# Patient Record
Sex: Male | Born: 2000 | Race: Black or African American | Hispanic: No | Marital: Single | Smoking: Current every day smoker
Health system: Southern US, Community
[De-identification: ages and names within clinical notes are randomized; demographics above are authoritative.]

## PROBLEM LIST (undated history)

## (undated) DIAGNOSIS — R0600 Dyspnea, unspecified: Secondary | ICD-10-CM

## (undated) DIAGNOSIS — B958 Unspecified staphylococcus as the cause of diseases classified elsewhere: Secondary | ICD-10-CM

## (undated) DIAGNOSIS — J45909 Unspecified asthma, uncomplicated: Secondary | ICD-10-CM

---
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
## 2019-02-04 DIAGNOSIS — Z5181 Encounter for therapeutic drug level monitoring: Secondary | ICD-10-CM | POA: Diagnosis not present

## 2019-02-20 DIAGNOSIS — Z5181 Encounter for therapeutic drug level monitoring: Secondary | ICD-10-CM | POA: Diagnosis not present

## 2019-02-25 DIAGNOSIS — Z5181 Encounter for therapeutic drug level monitoring: Secondary | ICD-10-CM | POA: Diagnosis not present

## 2019-03-06 DIAGNOSIS — Z5181 Encounter for therapeutic drug level monitoring: Secondary | ICD-10-CM | POA: Diagnosis not present

## 2019-03-12 DIAGNOSIS — Z5181 Encounter for therapeutic drug level monitoring: Secondary | ICD-10-CM | POA: Diagnosis not present

## 2019-03-19 DIAGNOSIS — Z5181 Encounter for therapeutic drug level monitoring: Secondary | ICD-10-CM | POA: Diagnosis not present

## 2019-03-25 DIAGNOSIS — Z5181 Encounter for therapeutic drug level monitoring: Secondary | ICD-10-CM | POA: Diagnosis not present

## 2019-10-30 DIAGNOSIS — Z419 Encounter for procedure for purposes other than remedying health state, unspecified: Secondary | ICD-10-CM | POA: Diagnosis not present

## 2019-11-30 DIAGNOSIS — Z419 Encounter for procedure for purposes other than remedying health state, unspecified: Secondary | ICD-10-CM | POA: Diagnosis not present

## 2019-12-22 ENCOUNTER — Other Ambulatory Visit: Payer: Self-pay

## 2019-12-22 ENCOUNTER — Encounter (HOSPITAL_COMMUNITY): Payer: Self-pay | Admitting: Emergency Medicine

## 2019-12-22 ENCOUNTER — Emergency Department (HOSPITAL_COMMUNITY)
Admission: EM | Admit: 2019-12-22 | Discharge: 2019-12-23 | Disposition: A | Discharge status: Home / Self Care | Payer: Medicaid Other | Attending: Emergency Medicine | Admitting: Emergency Medicine

## 2019-12-22 DIAGNOSIS — Z5321 Procedure and treatment not carried out due to patient leaving prior to being seen by health care provider: Secondary | ICD-10-CM | POA: Insufficient documentation

## 2019-12-22 DIAGNOSIS — Z113 Encounter for screening for infections with a predominantly sexual mode of transmission: Secondary | ICD-10-CM | POA: Diagnosis present

## 2019-12-22 LAB — URINALYSIS, ROUTINE W REFLEX MICROSCOPIC
Bilirubin Urine: NEGATIVE
Glucose, UA: NEGATIVE mg/dL
Hgb urine dipstick: NEGATIVE
Ketones, ur: NEGATIVE mg/dL
Leukocytes,Ua: NEGATIVE
Nitrite: NEGATIVE
Protein, ur: NEGATIVE mg/dL
Specific Gravity, Urine: 1.015 (ref 1.005–1.030)
pH: 6 (ref 5.0–8.0)
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
## 2019-12-23 ENCOUNTER — Encounter (HOSPITAL_COMMUNITY): Payer: Self-pay | Admitting: Emergency Medicine

## 2019-12-23 ENCOUNTER — Emergency Department (HOSPITAL_COMMUNITY)
Admission: EM | Admit: 2019-12-23 | Discharge: 2019-12-23 | Disposition: A | Discharge status: Home / Self Care | Payer: Medicaid Other | Attending: Emergency Medicine | Admitting: Emergency Medicine

## 2019-12-23 ENCOUNTER — Other Ambulatory Visit: Payer: Self-pay

## 2019-12-23 DIAGNOSIS — K1370 Unspecified lesions of oral mucosa: Secondary | ICD-10-CM | POA: Diagnosis present

## 2019-12-23 DIAGNOSIS — B009 Herpesviral infection, unspecified: Secondary | ICD-10-CM | POA: Diagnosis not present

## 2019-12-23 DIAGNOSIS — B001 Herpesviral vesicular dermatitis: Secondary | ICD-10-CM | POA: Diagnosis not present

## 2019-12-23 HISTORY — DX: Unspecified staphylococcus as the cause of diseases classified elsewhere: B95.8

## 2019-12-23 NOTE — ED Triage Notes (Signed)
Pt states he wants the bump on lip checked. States that it showed up yesterday, thinks it could "herpes."
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
## 2019-12-23 NOTE — Discharge Instructions (Signed)
Please use the attached instructions, read them thoroughly. Do not kiss, have oral sex, or share personal items until your sores heal. Come back to the emergency department if you have any new or worsening concerning symptoms as we discussed you can also go to the health department to get fully STD tested, which I would recommend. The medication that we spoke about was acyclovir if your lesions become worse or if this were to recur which needs to be prescribed, however as we spoke about these lesions will most likely heal on their own in 2 weeks.

## 2019-12-23 NOTE — ED Notes (Signed)
Pt stated he is going wait, states "this is too long for anyone to wait"

## 2019-12-24 ENCOUNTER — Telehealth: Payer: Self-pay | Admitting: *Deleted

## 2019-12-24 NOTE — Telephone Encounter (Signed)
Transition Care Management Unsuccessful Follow-up Telephone Call  Date of discharge and from where:  [facility], 12/23/19 Attempts:  1st Attempt  Reason for unsuccessful TCM follow-up call:  Left message with patient's father.  Burnard Bunting, RN, BSN, CCRN [facility] [phone]

## 2019-12-24 NOTE — Telephone Encounter (Signed)
Called patient to make NP appointment,he was unavailable left message with dad to call back                                                   Avie Arenas                                      PEC                                                [phone]

## 2019-12-26 ENCOUNTER — Telehealth: Payer: Self-pay | Admitting: *Deleted

## 2019-12-26 NOTE — Telephone Encounter (Signed)
Called patient again to assist with NP ,number listed is patient's dad's , says he was given call back number yesterday.                                  Ricky Kim                                   .

## 2019-12-31 DIAGNOSIS — Z419 Encounter for procedure for purposes other than remedying health state, unspecified: Secondary | ICD-10-CM | POA: Diagnosis not present

## 2020-01-30 DIAGNOSIS — Z419 Encounter for procedure for purposes other than remedying health state, unspecified: Secondary | ICD-10-CM | POA: Diagnosis not present

## 2020-03-01 DIAGNOSIS — Z419 Encounter for procedure for purposes other than remedying health state, unspecified: Secondary | ICD-10-CM | POA: Diagnosis not present

## 2020-03-31 DIAGNOSIS — Z419 Encounter for procedure for purposes other than remedying health state, unspecified: Secondary | ICD-10-CM | POA: Diagnosis not present

## 2020-05-01 DIAGNOSIS — Z419 Encounter for procedure for purposes other than remedying health state, unspecified: Secondary | ICD-10-CM | POA: Diagnosis not present

## 2020-06-01 DIAGNOSIS — Z419 Encounter for procedure for purposes other than remedying health state, unspecified: Secondary | ICD-10-CM | POA: Diagnosis not present

## 2020-06-29 DIAGNOSIS — Z419 Encounter for procedure for purposes other than remedying health state, unspecified: Secondary | ICD-10-CM | POA: Diagnosis not present

## 2020-07-14 NOTE — Progress Notes (Signed)
Please disregard ,opened in error 

## 2020-07-20 ENCOUNTER — Other Ambulatory Visit: Payer: Self-pay

## 2020-07-20 ENCOUNTER — Emergency Department (HOSPITAL_COMMUNITY)
Admission: EM | Admit: 2020-07-20 | Discharge: 2020-07-20 | Disposition: A | Discharge status: Home / Self Care | Payer: Medicaid Other | Attending: Emergency Medicine | Admitting: Emergency Medicine

## 2020-07-20 ENCOUNTER — Encounter (HOSPITAL_COMMUNITY): Payer: Self-pay

## 2020-07-20 DIAGNOSIS — Z113 Encounter for screening for infections with a predominantly sexual mode of transmission: Secondary | ICD-10-CM | POA: Insufficient documentation

## 2020-07-20 DIAGNOSIS — R21 Rash and other nonspecific skin eruption: Secondary | ICD-10-CM | POA: Diagnosis not present

## 2020-07-20 DIAGNOSIS — Z8619 Personal history of other infectious and parasitic diseases: Secondary | ICD-10-CM | POA: Insufficient documentation

## 2020-07-20 LAB — HIV ANTIBODY (ROUTINE TESTING W REFLEX): HIV Screen 4th Generation wRfx: NONREACTIVE

## 2020-07-20 MED ORDER — VALACYCLOVIR HCL 1 G PO TABS: 2000.0000 mg | ORAL_TABLET | Freq: Two times a day (BID) | ORAL | 3 refills | Status: AC | PRN

## 2020-07-20 NOTE — Discharge Instructions (Signed)
I have provided information about herpes labialis.  You only take the medicine prescribed for 2 doses, the day you notice tingling or soreness developing in your lip (the earlier you take the medicine when you recognize this symptom, the more likely it will help it go away quickly).  You have been given several refills if needed for future breakouts.    You have been screened for stds and will be notified for any positive tests, but you do not have any symptoms today suggesting any infections. You may follow up with the health department for any future std screenings or concerns.

## 2020-07-20 NOTE — ED Triage Notes (Signed)
Pt presents to ED with complaints of rash to bilateral arms and legs "for a while." Denies itching.

## 2020-07-21 ENCOUNTER — Telehealth (HOSPITAL_COMMUNITY): Payer: Self-pay | Admitting: Emergency Medicine

## 2020-07-21 ENCOUNTER — Telehealth: Payer: Self-pay

## 2020-07-21 LAB — GC/CHLAMYDIA PROBE AMP (~~LOC~~) NOT AT ARMC
Chlamydia: POSITIVE — AB
Comment: NEGATIVE
Comment: NORMAL
Neisseria Gonorrhea: NEGATIVE

## 2020-07-21 LAB — RPR: RPR Ser Ql: NONREACTIVE

## 2020-07-21 MED ORDER — DOXYCYCLINE HYCLATE 100 MG PO CAPS: 100.0000 mg | ORAL_CAPSULE | Freq: Two times a day (BID) | ORAL | 0 refills | Status: DC

## 2020-07-21 NOTE — Telephone Encounter (Signed)
Transition Care Management Unsuccessful Follow-up Telephone Call  Date of discharge and from where:  07/20/2020 from [facility]  Attempts:  1st Attempt  Reason for unsuccessful TCM follow-up call:  Unable to leave message

## 2020-07-21 NOTE — Telephone Encounter (Signed)
Pt's gc/chlamydia cx positive for chlamydia.  Doxycycline 100 mg bid x 7 days sent to [facility].  Message left on pt's mobile voice mail to contact the ED dept.

## 2020-07-21 NOTE — ED Provider Notes (Addendum)
[facility] EMERGENCY DEPARTMENT Provider Note   CSN: [id number] Arrival date & time: 07/20/20  0940     History Chief Complaint  Patient presents with  . Rash    Ricky Kim is a 20 y.o. 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  His primary concern is that he did not get the valtrex filled when he had the labial herpes outbreak and is concerned he has spreading disease by not getting the medicine filled.  He denies genital rash, also denies penile discharge, fevers, chills, abd pain, dysuria, sore throat, headache and has had no further outbreaks of the lip rash.  He is sexually active but not with the same partner that "gave him the herpes".    Is is interested in std screening today.   HPI     Past Medical History:  Diagnosis Date  . Staph infection     There are no problems to display for this patient.   History reviewed. No pertinent surgical history.     No family history on file.  Social History   Tobacco Use  . Smoking status: Never Smoker  . Smokeless tobacco: Never Used  Substance Use Topics  . Alcohol use: Never  . Drug use: Never    Home Medications Prior to Admission medications   Medication Sig Start Date End Date Taking? Authorizing Provider  valACYclovir (VALTREX) 1000 MG tablet Take 2 tablets (2,000 mg total) by mouth 2 (two) times daily as needed for up to 1 day (only take if you develop herpes labialis outbreak). 07/20/20 07/21/20 Yes IdolRaynelle Fanning, PA-C    Allergies    Patient has no known allergies.  Review of Systems   Review of Systems  Constitutional: Negative for chills and fever.  HENT: Negative for sore throat.   Eyes: Negative.   Respiratory: Negative for chest tightness and  shortness of breath.   Cardiovascular: Negative for chest pain.  Gastrointestinal: Negative for abdominal pain, nausea and vomiting.  Genitourinary: Negative.  Negative for dysuria, penile discharge and urgency.  Musculoskeletal: Negative for arthralgias, joint swelling and neck pain.  Skin: Positive for rash. Negative for wound.  Neurological: Negative for dizziness, weakness, light-headedness, numbness and headaches.  Psychiatric/Behavioral: Negative.     Physical Exam Updated Vital Signs BP 118/68   Pulse 64   Temp 98 F (36.7 C) (Oral)   Resp 16   Ht 5\' 8"  (1.727 m)   Wt 59.9 kg   SpO2 100%   BMI 20.07 kg/m   Physical Exam Vitals and nursing note reviewed.  Constitutional:      Appearance: He is well-developed.  HENT:     Head: Normocephalic and atraumatic.     Mouth/Throat:     Mouth: Mucous membranes are moist.     Pharynx: Oropharynx is clear.  Eyes:     Conjunctiva/sclera: Conjunctivae normal.  Cardiovascular:     Rate and Rhythm: Normal rate and regular rhythm.     Heart sounds: Normal heart sounds.  Pulmonary:     Effort: Pulmonary effort is normal.     Breath sounds: Normal breath sounds. No wheezing.  Abdominal:     General: Bowel sounds are normal.     Palpations: Abdomen is soft.     Tenderness: There is  no abdominal tenderness. There is no guarding.  Musculoskeletal:        General: Normal range of motion.     Cervical back: Normal range of motion.  Skin:    General: Skin is warm and dry.     Findings: No rash.  Neurological:     General: No focal deficit present.     Mental Status: He is alert.     ED Results / Procedures / Treatments   Labs (all labs ordered are listed, but only abnormal results are displayed) Labs Reviewed  GC/CHLAMYDIA PROBE AMP ([place]) NOT AT [facility] - Abnormal; Notable for the following components:      Result Value   Chlamydia Positive (*)    All other components within normal limits  RPR  HIV ANTIBODY (ROUTINE  TESTING W REFLEX)    EKG None  Radiology No results found.  Procedures Procedures   Medications Ordered in ED Medications - No data to display  ED Course  I have reviewed the triage vital signs and the nursing notes.  Pertinent labs & imaging results that were available during my care of the patient were reviewed by me and considered in my medical decision making (see chart for details).    MDM Rules/Calculators/A&P                          Significant time spent with patient discussing Herpes simplex I, transmission, reasons and timing of treatment.  He was given a prescription for valtrex but asked to hold the prescription and only take if he and when he develops another outbreak.  Reassurance given about the cold inducing rash, currently not present with normal exam.    STD screening provided at his request, although denies sx.  rpr added to rule out as atypical rash presentation.  Final Clinical Impression(s) / ED Diagnoses Final diagnoses:  Rash and nonspecific skin eruption  History of herpes labialis  Screening examination for STD (sexually transmitted disease)    Rx / DC Orders ED Discharge Orders         Ordered    valACYclovir (VALTREX) 1000 MG tablet  2 times daily PRN        07/20/20 1347           Burgess Amor, PA-C 07/21/20 1632   GC/cllamydia culture positive for chlamydia.  Doxycycline 100 mg bid x 7 days prescribed.  Pt to be notified.    Burgess Amor, PA-C 07/21/20 1641    Pricilla Loveless, MD 07/23/20 [id number]

## 2020-07-22 NOTE — Telephone Encounter (Signed)
Pt was not seen in the ED on 07/20/2020. He left without being seen. Message will be closed.

## 2020-07-30 DIAGNOSIS — Z419 Encounter for procedure for purposes other than remedying health state, unspecified: Secondary | ICD-10-CM | POA: Diagnosis not present

## 2020-08-08 ENCOUNTER — Encounter (HOSPITAL_COMMUNITY): Payer: Self-pay | Admitting: Emergency Medicine

## 2020-08-08 ENCOUNTER — Emergency Department (HOSPITAL_COMMUNITY): Payer: Medicaid Other

## 2020-08-08 ENCOUNTER — Other Ambulatory Visit: Payer: Self-pay

## 2020-08-08 ENCOUNTER — Emergency Department (HOSPITAL_COMMUNITY)
Admission: EM | Admit: 2020-08-08 | Discharge: 2020-08-08 | Disposition: A | Discharge status: Home / Self Care | Payer: Medicaid Other | Attending: Emergency Medicine | Admitting: Emergency Medicine

## 2020-08-08 DIAGNOSIS — M791 Myalgia, unspecified site: Secondary | ICD-10-CM

## 2020-08-08 DIAGNOSIS — Z20822 Contact with and (suspected) exposure to covid-19: Secondary | ICD-10-CM | POA: Insufficient documentation

## 2020-08-08 DIAGNOSIS — M7918 Myalgia, other site: Secondary | ICD-10-CM | POA: Diagnosis not present

## 2020-08-08 DIAGNOSIS — N341 Nonspecific urethritis: Secondary | ICD-10-CM | POA: Insufficient documentation

## 2020-08-08 DIAGNOSIS — R079 Chest pain, unspecified: Secondary | ICD-10-CM

## 2020-08-08 DIAGNOSIS — N342 Other urethritis: Secondary | ICD-10-CM

## 2020-08-08 LAB — RAPID URINE DRUG SCREEN, HOSP PERFORMED
Amphetamines: NOT DETECTED
Barbiturates: NOT DETECTED
Benzodiazepines: NOT DETECTED
Cocaine: NOT DETECTED
Opiates: NOT DETECTED
Tetrahydrocannabinol: POSITIVE — AB

## 2020-08-08 LAB — HIV ANTIBODY (ROUTINE TESTING W REFLEX): HIV Screen 4th Generation wRfx: NONREACTIVE

## 2020-08-08 LAB — COMPREHENSIVE METABOLIC PANEL
ALT: 20 U/L (ref 0–44)
AST: 25 U/L (ref 15–41)
Albumin: 4.1 g/dL (ref 3.5–5.0)
Alkaline Phosphatase: 58 U/L (ref 38–126)
Anion gap: 8 (ref 5–15)
BUN: 13 mg/dL (ref 6–20)
CO2: 24 mmol/L (ref 22–32)
Calcium: 9.6 mg/dL (ref 8.9–10.3)
Chloride: 105 mmol/L (ref 98–111)
Creatinine, Ser: 0.96 mg/dL (ref 0.61–1.24)
GFR, Estimated: >60 mL/min (ref >60)
Glucose, Bld: 96 mg/dL (ref 70–99)
Potassium: 3.8 mmol/L (ref 3.5–5.1)
Sodium: 137 mmol/L (ref 135–145)
Total Bilirubin: 0.5 mg/dL (ref 0.3–1.2)
Total Protein: 7.5 g/dL (ref 6.5–8.1)

## 2020-08-08 LAB — URINALYSIS, ROUTINE W REFLEX MICROSCOPIC
Bilirubin Urine: NEGATIVE
Glucose, UA: NEGATIVE mg/dL
Hgb urine dipstick: NEGATIVE
Ketones, ur: NEGATIVE mg/dL
Leukocytes,Ua: NEGATIVE
Nitrite: NEGATIVE
Protein, ur: NEGATIVE mg/dL
Specific Gravity, Urine: 1.017 (ref 1.005–1.030)
pH: 6 (ref 5.0–8.0)

## 2020-08-08 LAB — TROPONIN I (HIGH SENSITIVITY)
Troponin I (High Sensitivity): 3 ng/L (ref <18)
Troponin I (High Sensitivity): 3 ng/L (ref <18)

## 2020-08-08 LAB — CBC
HCT: 47.2 % (ref 39.0–52.0)
Hemoglobin: 15.9 g/dL (ref 13.0–17.0)
MCH: 32.8 pg (ref 26.0–34.0)
MCHC: 33.7 g/dL (ref 30.0–36.0)
MCV: 97.3 fL (ref 80.0–100.0)
Platelets: 184 10*3/uL (ref 150–400)
RBC: 4.85 MIL/uL (ref 4.22–5.81)
RDW: 11.2 % — ABNORMAL LOW (ref 11.5–15.5)
WBC: 6.9 10*3/uL (ref 4.0–10.5)
nRBC: 0 % (ref 0.0–0.2)

## 2020-08-08 LAB — SARS CORONAVIRUS 2 (TAT 6-24 HRS): SARS Coronavirus 2: NEGATIVE

## 2020-08-08 LAB — LIPASE, BLOOD: Lipase: 26 U/L (ref 11–51)

## 2020-08-08 MED ORDER — CEFTRIAXONE SODIUM 500 MG IJ SOLR
500.0000 mg | Freq: Once | INTRAMUSCULAR | Status: AC
  Administered 2020-08-08: 500 mg via INTRAMUSCULAR
  Filled 2020-08-08: qty 500

## 2020-08-08 MED ORDER — DOXYCYCLINE HYCLATE 100 MG PO CAPS: 100.0000 mg | ORAL_CAPSULE | Freq: Two times a day (BID) | ORAL | 0 refills | Status: DC

## 2020-08-08 MED ORDER — STERILE WATER FOR INJECTION IJ SOLN
INTRAMUSCULAR | Status: AC
  Filled 2020-08-08: qty 10

## 2020-08-08 NOTE — Discharge Instructions (Addendum)
See your doctor on 08/19/20, as scheduled. Take Tylenol every 4 hours as needed for pain. Make sure that you are drinking 1-2 liters of water every day, eating healthy foods, and getting 8 hours of sleep every night.  Have your sexual partner checked for STD.

## 2020-08-08 NOTE — ED Triage Notes (Addendum)
Pt reports feeling tired, abd pain, body aches, SOB, and chest pain x 1 year.

## 2020-08-08 NOTE — ED Provider Notes (Signed)
[facility] EMERGENCY DEPARTMENT Provider Note   CSN: [id number] Arrival date & time: 08/08/20  1119     History Chief Complaint  Patient presents with  . Chest Pain    Ricky Kim is a 20 y.o. male.  HPI Patient seen by me at 11:39 AM.  Sitting on chair at triage.  He states he is having both chest pain and generalized pain.  He states the chest pain has been present for a year, and got worse yesterday.  He states he feels like something is" wrong inside his body."  He states that sometimes he has bumps on his skin when he is cold and they go away when he gets warm.  He has not been seen or treated for these problems yet.  He is unable to describe the character of the pain.  He denies any chronic illnesses.  He denies shortness of breath, cough, focal weakness or paresthesia.  He is ambulatory and came here by private vehicle.    Past Medical History:  Diagnosis Date  . Staph infection     There are no problems to display for this patient.   History reviewed. No pertinent surgical history.     No family history on file.  Social History   Tobacco Use  . Smoking status: Never Smoker  . Smokeless tobacco: Never Used  Substance Use Topics  . Alcohol use: Yes  . Drug use: Yes    Types: Marijuana    Home Medications Prior to Admission medications   Medication Sig Start Date End Date Taking? Authorizing Provider  doxycycline (VIBRAMYCIN) 100 MG capsule Take 1 capsule (100 mg total) by mouth 2 (two) times daily. One po bid x 7 days 08/08/20  Yes Mancel Bale, MD  amoxicillin-clavulanate (AUGMENTIN ES-600) 600-42.9 MG/5ML suspension Take 5 mLs (600 mg total) by mouth 2 (two) times daily. 02/26/12   Marcellina Millin, MD  diphenhydrAMINE (BENADRYL) 12.5 MG/5ML liquid Take 6.25 mg by mouth 4 (four) times daily as needed. For allergies/itching    [provider]  trimethoprim-polymyxin b (POLYTRIM) ophthalmic solution Place 1 drop into the right  eye every 4 (four) hours. X 7 days 09/08/13   Ria Clock, PA    Allergies    Patient has no known allergies.  Review of Systems   Review of Systems  All other systems reviewed and are negative.   Physical Exam Updated Vital Signs BP (!) 139/94   Pulse 60   Temp 98.4 F (36.9 C)   Resp 19   SpO2 100%   Physical Exam Vitals and nursing note reviewed.  Constitutional:      General: He is not in acute distress.    Appearance: He is well-developed. He is not ill-appearing.  HENT:     Head: Normocephalic and atraumatic.     Right Ear: External ear normal.     Left Ear: External ear normal.  Eyes:     Conjunctiva/sclera: Conjunctivae normal.     Pupils: Pupils are equal, round, and reactive to light.  Neck:     Trachea: Phonation normal.  Cardiovascular:     Rate and Rhythm: Normal rate and regular rhythm.     Heart sounds: Normal heart sounds.  Pulmonary:     Effort: Pulmonary effort is normal. No respiratory distress.     Breath sounds: Normal breath sounds. No stridor.  Abdominal:     General: There is no distension.     Palpations: Abdomen is  soft.     Tenderness: There is no abdominal tenderness.  Musculoskeletal:        General: Normal range of motion.     Cervical back: Normal range of motion and neck supple.  Skin:    General: Skin is warm and dry.  Neurological:     Mental Status: He is alert and oriented to person, place, and time.     Cranial Nerves: No cranial nerve deficit.     Sensory: No sensory deficit.     Motor: No abnormal muscle tone.     Coordination: Coordination normal.  Psychiatric:        Attention and Perception: Attention normal.        Mood and Affect: Mood is anxious.        Speech: He is communicative.        Behavior: Behavior is not agitated, slowed or withdrawn.        Thought Content: Thought content is not paranoid.        Cognition and Memory: Cognition is not impaired.     ED Results / Procedures / Treatments    Labs (all labs ordered are listed, but only abnormal results are displayed) Labs Reviewed  CBC - Abnormal; Notable for the following components:      Result Value   RDW 11.2 (*)    All other components within normal limits  SARS CORONAVIRUS 2 (TAT 6-24 HRS)  LIPASE, BLOOD  COMPREHENSIVE METABOLIC PANEL  URINALYSIS, ROUTINE W REFLEX MICROSCOPIC  RAPID URINE DRUG SCREEN, HOSP PERFORMED  RPR  HIV ANTIBODY (ROUTINE TESTING W REFLEX)  GC/CHLAMYDIA PROBE AMP ([place]) NOT AT [facility]  TROPONIN I (HIGH SENSITIVITY)  TROPONIN I (HIGH SENSITIVITY)    EKG EKG Interpretation  Date/Time:  Sunday August 08 2020 11:34:37 EDT Ventricular Rate:  67 PR Interval:  128 QRS Duration: 82 QT Interval:  368 QTC Calculation: 388 R Axis:   69 Text Interpretation: Sinus rhythm with Premature atrial complexes with Abberant conduction Nonspecific ST abnormality Abnormal ECG No old tracing to compare Confirmed by Mancel Bale [id number]) on 08/08/2020 11:51:33 AM   Radiology DG Chest Port 1 View  Result Date: 08/08/2020 CLINICAL DATA:  Chest pain and weight loss EXAM: PORTABLE CHEST 1 VIEW COMPARISON:  None FINDINGS: Lungs are clear. The heart size and pulmonary vascularity are normal. No adenopathy. No pneumothorax. No bone lesions. IMPRESSION: Lungs clear.  Cardiac silhouette normal. Electronically Signed   By: Bretta Bang Kim M.D.   On: 08/08/2020 13:20    Procedures Procedures   Medications Ordered in ED Medications  cefTRIAXone (ROCEPHIN) injection 500 mg (has no administration in time range)    ED Course  I have reviewed the triage vital signs and the nursing notes.  Pertinent labs & imaging results that were available during my care of the patient were reviewed by me and considered in my medical decision making (see chart for details).  Clinical Course as of 08/08/20 1447  Sun Aug 08, 2020  1153 I discussed abnormal EKG with Dr. Eldridge Dace, on-call for STEMI.  He does not feel that  this EKG and clinical presentation are consistent with ST segment elevation MI. [EW]  1153 We will proceed with usual ED evaluation and reassessment. [EW]  1408 He has an appointment for PCP eval., new visit, on 08/19/20.  [EW]  1408 He request Covid test.Will send and d/c. He will check result on MyChart. [EW]    Clinical Course User Index [EW] Mancel Bale, MD  MDM Rules/Calculators/A&P                           Patient Vitals for the past 24 hrs:  BP Temp Pulse Resp SpO2  08/08/20 1415 (!) 139/94 -- 60 19 100 %  08/08/20 1400 127/77 -- 64 13 100 %  08/08/20 1330 127/83 -- (!) 57 (!) 25 100 %  08/08/20 1315 (!) 138/100 -- (!) 59 20 99 %  08/08/20 1300 (!) 114/100 -- 69 12 100 %  08/08/20 1148 (!) 135/94 98.4 F (36.9 C) 60 14 99 %    2:43 PM Reevaluation with update and discussion. After initial assessment and treatment, an updated evaluation reveals after initial discharge, he complained of urethral discharge and wondered if he had an STD.  Examined at this time, he has normal-appearing external genitalia, he is uncircumcised.  Testicles are nontender and there are no scrotal abnormalities.  Urethra has a minimal amount of slightly opaque discharge.  GC chlamydia probe taken from the urethral orifice.  Patient treated with Rocephin empirically for GC and prescription given for doxycycline for possible chlamydia infection.Mancel Bale   Medical Decision Making:  This patient is presenting for evaluation of chest pain, myalgia, urethral discharge, which does require a range of treatment options, and is a complaint that involves a moderate risk of morbidity and mortality. The differential diagnoses include ACS, pulmonary disorder, acute infectious process, urethritis. I decided to review old records, and in summary healthy young male presenting for nonspecific symptoms requiring ED evaluation..  I did not require additional historical information from anyone.  Clinical Laboratory  Tests Ordered, included CBC, Metabolic panel and UDS, troponin, COVID test, STD swab from urethral meatus, HIV testing ordered. Review indicates patient laboratory tests all normal, labs pending at discharge. Radiologic Tests Ordered, included chest Xray.  I independently Visualized: Radiograph images, which show no acute abnormality  Cardiac Monitor Tracing which shows normal sinus rhythm   Critical Interventions-clinical monitoring, laboratory testing, observation,reevaluation.  Treat empirically for urethritis  After These Interventions, the Patient was reevaluated and was found stable for discharge.  Doubt ACS, or acute metabolic disorder.  Patient with incidental complaint of urethral discharge possibly related to STD.  Treated empirically in the ED and prescription given for doxycycline.  PCR pending at discharge.  CRITICAL CARE-no Performed by: Mancel Bale  Nursing Notes Reviewed/ Care Coordinated Applicable Imaging Reviewed Interpretation of Laboratory Data incorporated into ED treatment  The patient appears reasonably screened and/or stabilized for discharge and I doubt any other medical condition or other [facility] requiring further screening, evaluation, or treatment in the ED at this time prior to discharge.  Plan: Home Medications-OTC analgesia of choice; Home Treatments-encouraged sexual health, and gradually increasing activity; return here if the recommended treatment, does not improve the symptoms; Recommended follow up-follow-up with the PCP on 08/19/2020 as scheduled.  Have her review old records from today, and discuss ongoing management.     Final Clinical Impression(s) / ED Diagnoses Final diagnoses:  Nonspecific chest pain  Myalgia  Urethritis    Rx / DC Orders ED Discharge Orders         Ordered    doxycycline (VIBRAMYCIN) 100 MG capsule  2 times daily        08/08/20 1440           Mancel Bale, MD 08/08/20 1447

## 2020-08-09 ENCOUNTER — Telehealth: Payer: Self-pay | Admitting: *Deleted

## 2020-08-09 LAB — GC/CHLAMYDIA PROBE AMP (~~LOC~~) NOT AT ARMC
Chlamydia: NEGATIVE
Comment: NEGATIVE
Comment: NORMAL
Neisseria Gonorrhea: NEGATIVE

## 2020-08-09 LAB — RPR: RPR Ser Ql: NONREACTIVE

## 2020-08-09 NOTE — Telephone Encounter (Signed)
Transition Care Management Follow-up Telephone Call  Date of discharge and from where: 08/08/2020 - [facility]  How have you been since you were released from the hospital? "Still feel the same"  Any questions or concerns? No  Items Reviewed:  Did the pt receive and understand the discharge instructions provided? Yes   Medications obtained and verified? Yes   Other? No   Any new allergies since your discharge? No   Dietary orders reviewed? No  Do you have support at home? Yes    Functional Questionnaire: (I = Independent and D = Dependent) ADLs: I  Bathing/Dressing- I  Meal Prep- I  Eating- I  Maintaining continence- I  Transferring/Ambulation- I  Managing Meds- I  Follow up appointments reviewed:   PCP Hospital f/u appt confirmed? Yes  Scheduled to see Gwinda Passe, NP with [facility] on 08/19/2020 @ 1330.  Specialist Hospital f/u appt confirmed? No    Are transportation arrangements needed? No   If their condition worsens, is the pt aware to call PCP or go to the Emergency Dept.? Yes  Was the patient provided with contact information for the PCP's office or ED? Yes  Was to pt encouraged to call back with questions or concerns? Yes

## 2020-08-17 ENCOUNTER — Emergency Department (HOSPITAL_COMMUNITY)
Admission: EM | Admit: 2020-08-17 | Discharge: 2020-08-17 | Disposition: A | Discharge status: Home / Self Care | Payer: Medicaid Other | Attending: Emergency Medicine | Admitting: Emergency Medicine

## 2020-08-17 ENCOUNTER — Other Ambulatory Visit: Payer: Self-pay

## 2020-08-17 ENCOUNTER — Encounter (HOSPITAL_COMMUNITY): Payer: Self-pay | Admitting: *Deleted

## 2020-08-17 DIAGNOSIS — S61234A Puncture wound without foreign body of right ring finger without damage to nail, initial encounter: Secondary | ICD-10-CM | POA: Diagnosis not present

## 2020-08-17 DIAGNOSIS — Z23 Encounter for immunization: Secondary | ICD-10-CM | POA: Insufficient documentation

## 2020-08-17 DIAGNOSIS — S61215A Laceration without foreign body of left ring finger without damage to nail, initial encounter: Secondary | ICD-10-CM | POA: Insufficient documentation

## 2020-08-17 DIAGNOSIS — S61235A Puncture wound without foreign body of left ring finger without damage to nail, initial encounter: Secondary | ICD-10-CM | POA: Insufficient documentation

## 2020-08-17 DIAGNOSIS — S61214A Laceration without foreign body of right ring finger without damage to nail, initial encounter: Secondary | ICD-10-CM | POA: Diagnosis not present

## 2020-08-17 MED ORDER — SULFAMETHOXAZOLE-TRIMETHOPRIM 800-160 MG PO TABS: 1.0000 | ORAL_TABLET | Freq: Two times a day (BID) | ORAL | 0 refills | Status: AC

## 2020-08-17 MED ORDER — TETANUS-DIPHTH-ACELL PERTUSSIS 5-2.5-18.5 LF-MCG/0.5 IM SUSY
0.5000 mL | PREFILLED_SYRINGE | Freq: Once | INTRAMUSCULAR | Status: AC
  Administered 2020-08-17: 0.5 mL via INTRAMUSCULAR
  Filled 2020-08-17: qty 0.5
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
## 2020-08-17 NOTE — ED Notes (Signed)
Unable to sign consent due to injury
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
## 2020-08-17 NOTE — ED Notes (Signed)
On duty [employer] officer to speak with pt, stated that address [place] it [place] and will send a deputy here to speak with pt.
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
## 2020-08-19 ENCOUNTER — Encounter (INDEPENDENT_AMBULATORY_CARE_PROVIDER_SITE_OTHER): Payer: Self-pay

## 2020-08-19 ENCOUNTER — Telehealth (INDEPENDENT_AMBULATORY_CARE_PROVIDER_SITE_OTHER): Payer: Medicaid Other | Admitting: Primary Care

## 2020-08-19 DIAGNOSIS — M79642 Pain in left hand: Secondary | ICD-10-CM | POA: Diagnosis not present

## 2020-08-19 DIAGNOSIS — M79641 Pain in right hand: Secondary | ICD-10-CM | POA: Diagnosis not present

## 2020-08-19 NOTE — Progress Notes (Addendum)
Called patient to review pre-procedure instructions for surgery tomorrow. Patient had unreliable phone service and poor reception. Was able to communicate NPO after midnight and arrival place and time of 1000am. Verbalized understanding and stated he was writing it down. Patient will need COVID test DOS.

## 2020-08-20 ENCOUNTER — Encounter (HOSPITAL_COMMUNITY)
Admission: RE | Disposition: A | Discharge status: Home / Self Care | Payer: Self-pay | Source: Home / Self Care | Attending: Orthopedic Surgery

## 2020-08-20 ENCOUNTER — Ambulatory Visit (HOSPITAL_COMMUNITY)
Admission: RE | Admit: 2020-08-20 | Discharge: 2020-08-20 | Disposition: A | Discharge status: Home / Self Care | Payer: Medicaid Other | Attending: Orthopedic Surgery | Admitting: Orthopedic Surgery

## 2020-08-20 ENCOUNTER — Ambulatory Visit (HOSPITAL_COMMUNITY): Payer: Medicaid Other | Admitting: Certified Registered Nurse Anesthetist

## 2020-08-20 ENCOUNTER — Encounter (HOSPITAL_COMMUNITY): Payer: Self-pay | Admitting: Orthopedic Surgery

## 2020-08-20 ENCOUNTER — Other Ambulatory Visit: Payer: Self-pay

## 2020-08-20 DIAGNOSIS — S66122A Laceration of flexor muscle, fascia and tendon of right middle finger at wrist and hand level, initial encounter: Secondary | ICD-10-CM | POA: Insufficient documentation

## 2020-08-20 DIAGNOSIS — S64495A Injury of digital nerve of left ring finger, initial encounter: Secondary | ICD-10-CM | POA: Diagnosis not present

## 2020-08-20 DIAGNOSIS — S64494A Injury of digital nerve of right ring finger, initial encounter: Secondary | ICD-10-CM | POA: Diagnosis not present

## 2020-08-20 DIAGNOSIS — S61412A Laceration without foreign body of left hand, initial encounter: Secondary | ICD-10-CM | POA: Diagnosis not present

## 2020-08-20 DIAGNOSIS — S66120A Laceration of flexor muscle, fascia and tendon of right index finger at wrist and hand level, initial encounter: Secondary | ICD-10-CM | POA: Insufficient documentation

## 2020-08-20 DIAGNOSIS — W260XXA Contact with knife, initial encounter: Secondary | ICD-10-CM | POA: Insufficient documentation

## 2020-08-20 DIAGNOSIS — S61411A Laceration without foreign body of right hand, initial encounter: Secondary | ICD-10-CM | POA: Diagnosis not present

## 2020-08-20 DIAGNOSIS — Z20822 Contact with and (suspected) exposure to covid-19: Secondary | ICD-10-CM | POA: Insufficient documentation

## 2020-08-20 DIAGNOSIS — S66124A Laceration of flexor muscle, fascia and tendon of right ring finger at wrist and hand level, initial encounter: Secondary | ICD-10-CM | POA: Insufficient documentation

## 2020-08-20 DIAGNOSIS — S66126A Laceration of flexor muscle, fascia and tendon of right little finger at wrist and hand level, initial encounter: Secondary | ICD-10-CM | POA: Insufficient documentation

## 2020-08-20 DIAGNOSIS — S64496A Injury of digital nerve of right little finger, initial encounter: Secondary | ICD-10-CM | POA: Insufficient documentation

## 2020-08-20 DIAGNOSIS — S66125A Laceration of flexor muscle, fascia and tendon of left ring finger at wrist and hand level, initial encounter: Secondary | ICD-10-CM | POA: Diagnosis not present

## 2020-08-20 HISTORY — DX: Dyspnea, unspecified: R06.00

## 2020-08-20 HISTORY — PX: INCISION AND DRAINAGE WOUND WITH TENDON REPAIR: SHX5639

## 2020-08-20 LAB — SARS CORONAVIRUS 2 BY RT PCR (HOSPITAL ORDER, PERFORMED IN ~~LOC~~ HOSPITAL LAB): SARS Coronavirus 2: NEGATIVE

## 2020-08-20 SURGERY — INCISION AND DRAINAGE WOUND WITH TENDON REPAIR
Anesthesia: General | Laterality: Bilateral

## 2020-08-20 MED ORDER — SODIUM CHLORIDE 0.9 % IR SOLN
Status: DC | PRN
  Administered 2020-08-20: 1000 mL

## 2020-08-20 MED ORDER — CEFAZOLIN SODIUM-DEXTROSE 2-4 GM/100ML-% IV SOLN
2.0000 g | INTRAVENOUS | Status: AC
  Administered 2020-08-20: 2 g via INTRAVENOUS

## 2020-08-20 MED ORDER — BUPIVACAINE HCL (PF) 0.25 % IJ SOLN
INTRAMUSCULAR | Status: AC
  Filled 2020-08-20: qty 30

## 2020-08-20 MED ORDER — FENTANYL CITRATE (PF) 250 MCG/5ML IJ SOLN
INTRAMUSCULAR | Status: AC
  Filled 2020-08-20: qty 5

## 2020-08-20 MED ORDER — CHLORHEXIDINE GLUCONATE 0.12 % MT SOLN: 15.0000 mL | Freq: Once | OROMUCOSAL | Status: AC

## 2020-08-20 MED ORDER — METHOCARBAMOL 500 MG PO TABS: 500.0000 mg | ORAL_TABLET | Freq: Four times a day (QID) | ORAL | 0 refills | Status: DC

## 2020-08-20 MED ORDER — BUPIVACAINE HCL (PF) 0.25 % IJ SOLN: INTRAMUSCULAR | Status: DC | PRN

## 2020-08-20 MED ORDER — ONDANSETRON HCL 4 MG/2ML IJ SOLN: 4.0000 mg | Freq: Once | INTRAMUSCULAR | Status: DC | PRN

## 2020-08-20 MED ORDER — ROCURONIUM BROMIDE 10 MG/ML (PF) SYRINGE
PREFILLED_SYRINGE | INTRAVENOUS | Status: DC | PRN
  Administered 2020-08-20: 20 mg via INTRAVENOUS
  Administered 2020-08-20: 50 mg via INTRAVENOUS

## 2020-08-20 MED ORDER — CHLORHEXIDINE GLUCONATE 0.12 % MT SOLN
OROMUCOSAL | Status: AC
  Administered 2020-08-20: 15 mL via OROMUCOSAL
  Filled 2020-08-20: qty 15

## 2020-08-20 MED ORDER — CHLORHEXIDINE GLUCONATE 0.12 % MT SOLN
15.0000 mL | OROMUCOSAL | Status: DC
  Filled 2020-08-20: qty 15

## 2020-08-20 MED ORDER — DEXAMETHASONE SODIUM PHOSPHATE 10 MG/ML IJ SOLN
INTRAMUSCULAR | Status: AC
  Filled 2020-08-20: qty 1

## 2020-08-20 MED ORDER — AMISULPRIDE (ANTIEMETIC) 5 MG/2ML IV SOLN: 10.0000 mg | Freq: Once | INTRAVENOUS | Status: DC | PRN

## 2020-08-20 MED ORDER — DEXAMETHASONE SODIUM PHOSPHATE 10 MG/ML IJ SOLN
INTRAMUSCULAR | Status: DC | PRN
  Administered 2020-08-20: 5 mg via INTRAVENOUS

## 2020-08-20 MED ORDER — MIDAZOLAM HCL 5 MG/5ML IJ SOLN
INTRAMUSCULAR | Status: DC | PRN
  Administered 2020-08-20: 2 mg via INTRAVENOUS

## 2020-08-20 MED ORDER — OXYCODONE HCL 5 MG PO TABS
ORAL_TABLET | ORAL | Status: AC
  Filled 2020-08-20: qty 1

## 2020-08-20 MED ORDER — FENTANYL CITRATE (PF) 250 MCG/5ML IJ SOLN
INTRAMUSCULAR | Status: DC | PRN
  Administered 2020-08-20: 50 ug via INTRAVENOUS
  Administered 2020-08-20: 100 ug via INTRAVENOUS
  Administered 2020-08-20 (×3): 50 ug via INTRAVENOUS

## 2020-08-20 MED ORDER — OXYCODONE HCL 5 MG PO TABS
5.0000 mg | ORAL_TABLET | Freq: Once | ORAL | Status: AC | PRN
  Administered 2020-08-20: 5 mg via ORAL

## 2020-08-20 MED ORDER — CEFAZOLIN SODIUM-DEXTROSE 2-4 GM/100ML-% IV SOLN
INTRAVENOUS | Status: AC
  Filled 2020-08-20: qty 100

## 2020-08-20 MED ORDER — LIDOCAINE 2% (20 MG/ML) 5 ML SYRINGE
INTRAMUSCULAR | Status: DC | PRN
  Administered 2020-08-20: 20 mg via INTRAVENOUS

## 2020-08-20 MED ORDER — LACTATED RINGERS IV SOLN: INTRAVENOUS | Status: DC

## 2020-08-20 MED ORDER — SUGAMMADEX SODIUM 200 MG/2ML IV SOLN
INTRAVENOUS | Status: DC | PRN
  Administered 2020-08-20: 180 mg via INTRAVENOUS

## 2020-08-20 MED ORDER — MIDAZOLAM HCL 2 MG/2ML IJ SOLN
INTRAMUSCULAR | Status: AC
  Filled 2020-08-20: qty 2

## 2020-08-20 MED ORDER — OXYCODONE HCL 5 MG/5ML PO SOLN: 5.0000 mg | Freq: Once | ORAL | Status: AC | PRN

## 2020-08-20 MED ORDER — PROPOFOL 10 MG/ML IV BOLUS
INTRAVENOUS | Status: DC | PRN
  Administered 2020-08-20: 150 mg via INTRAVENOUS

## 2020-08-20 MED ORDER — ORAL CARE MOUTH RINSE
15.0000 mL | Freq: Once | OROMUCOSAL | Status: AC
Start: 2020-08-20 — End: 2020-08-20

## 2020-08-20 MED ORDER — FENTANYL CITRATE (PF) 100 MCG/2ML IJ SOLN: 25.0000 ug | INTRAMUSCULAR | Status: DC | PRN

## 2020-08-20 MED ORDER — OXYCODONE HCL 5 MG PO TABS: 5.0000 mg | ORAL_TABLET | ORAL | 0 refills | Status: DC | PRN

## 2020-08-20 MED ORDER — ONDANSETRON HCL 4 MG/2ML IJ SOLN
INTRAMUSCULAR | Status: DC | PRN
  Administered 2020-08-20: 4 mg via INTRAVENOUS

## 2020-08-20 SURGICAL SUPPLY — 51 items
BLADE 15 SAFETY STRL DISP (BLADE) ×12 IMPLANT
BNDG ELASTIC 3X5.8 VLCR STR LF (GAUZE/BANDAGES/DRESSINGS) ×4 IMPLANT
BNDG ELASTIC 4X5.8 VLCR STR LF (GAUZE/BANDAGES/DRESSINGS) ×4 IMPLANT
BNDG GAUZE ELAST 4 BULKY (GAUZE/BANDAGES/DRESSINGS) ×4 IMPLANT
CORD BIPOLAR FORCEPS 12FT (ELECTRODE) ×2 IMPLANT
COVER SURGICAL LIGHT HANDLE (MISCELLANEOUS) ×2 IMPLANT
CUFF TOURN SGL QUICK 18X4 (TOURNIQUET CUFF) ×4 IMPLANT
DECANTER SPIKE VIAL GLASS SM (MISCELLANEOUS) ×2 IMPLANT
DRAPE EXTREMITY T 121X128X90 (DISPOSABLE) ×2 IMPLANT
DRAPE SURG 17X23 STRL (DRAPES) ×2 IMPLANT
GAUZE SPONGE 2X2 8PLY STRL LF (GAUZE/BANDAGES/DRESSINGS) ×2 IMPLANT
GAUZE SPONGE 4X4 12PLY STRL (GAUZE/BANDAGES/DRESSINGS) ×4 IMPLANT
GAUZE SPONGE 4X4 16PLY XRAY LF (GAUZE/BANDAGES/DRESSINGS) ×6 IMPLANT
GAUZE XEROFORM 5X9 LF (GAUZE/BANDAGES/DRESSINGS) ×2 IMPLANT
GLOVE BIOGEL M 8.0 STRL (GLOVE) ×6 IMPLANT
GLOVE SS BIOGEL STRL SZ 8 (GLOVE) ×1 IMPLANT
GLOVE SUPERSENSE BIOGEL SZ 8 (GLOVE) ×1
GOWN STRL REUS W/ TWL LRG LVL3 (GOWN DISPOSABLE) ×2 IMPLANT
GOWN STRL REUS W/ TWL XL LVL3 (GOWN DISPOSABLE) ×3 IMPLANT
GOWN STRL REUS W/TWL LRG LVL3 (GOWN DISPOSABLE) ×2
GOWN STRL REUS W/TWL XL LVL3 (GOWN DISPOSABLE) ×3
KIT BASIN OR (CUSTOM PROCEDURE TRAY) ×2 IMPLANT
KIT TURNOVER KIT B (KITS) ×2 IMPLANT
LOOP VESSEL MAXI BLUE (MISCELLANEOUS) ×4 IMPLANT
MANIFOLD NEPTUNE II (INSTRUMENTS) ×2 IMPLANT
NEEDLE HYPO 25GX1X1/2 BEV (NEEDLE) ×2 IMPLANT
NS IRRIG 1000ML POUR BTL (IV SOLUTION) ×2 IMPLANT
PACK ORTHO EXTREMITY (CUSTOM PROCEDURE TRAY) ×2 IMPLANT
PAD ARMBOARD 7.5X6 YLW CONV (MISCELLANEOUS) ×4 IMPLANT
PAD CAST 4YDX4 CTTN HI CHSV (CAST SUPPLIES) ×2 IMPLANT
PADDING CAST COTTON 4X4 STRL (CAST SUPPLIES) ×2
PASSER SUT SWANSON 36MM LOOP (INSTRUMENTS) ×2 IMPLANT
PILLOW ARM CARTER ADULT (MISCELLANEOUS) ×4 IMPLANT
SOL PREP POV-IOD 4OZ 10% (MISCELLANEOUS) ×8 IMPLANT
SPECIMEN JAR SMALL (MISCELLANEOUS) ×2 IMPLANT
SPLINT FIBERGLASS 3X12 (CAST SUPPLIES) ×8 IMPLANT
SPONGE GAUZE 2X2 STER 10/PKG (GAUZE/BANDAGES/DRESSINGS) ×2
SPONGE LAP 18X18 RF (DISPOSABLE) ×4 IMPLANT
SUT MERSILENE 4 0 P 3 (SUTURE) IMPLANT
SUT PROLENE 4 0 PS 2 18 (SUTURE) ×18 IMPLANT
SUT PROLENE 6 0 P 3 18 (SUTURE) ×4 IMPLANT
SUT SUPRAMID 3-0 (SUTURE) ×16 IMPLANT
SUT SUPRAMID 4-0 (SUTURE) ×12 IMPLANT
SUT VIC AB 2-0 CT1 27 (SUTURE)
SUT VIC AB 2-0 CT1 TAPERPNT 27 (SUTURE) IMPLANT
SYR CONTROL 10ML LL (SYRINGE) ×2 IMPLANT
TOWEL GREEN STERILE (TOWEL DISPOSABLE) ×2 IMPLANT
TOWEL GREEN STERILE FF (TOWEL DISPOSABLE) ×4 IMPLANT
TUBE CONNECTING 12X1/4 (SUCTIONS) ×2 IMPLANT
UNDERPAD 30X36 HEAVY ABSORB (UNDERPADS AND DIAPERS) ×4 IMPLANT
WATER STERILE IRR 1000ML POUR (IV SOLUTION) ×2 IMPLANT

## 2020-08-20 NOTE — Anesthesia Preprocedure Evaluation (Signed)
Anesthesia Evaluation  Patient identified by MRN, date of birth, ID band Patient awake    Reviewed: Allergy & Precautions, NPO status , Patient's Chart, lab work & pertinent test results  History of Anesthesia Complications Negative for: history of anesthetic complications  Airway Mallampati: II  TM Distance: >3 FB Neck ROM: Full    Dental  (+) Teeth Intact   Pulmonary neg pulmonary ROS,    Pulmonary exam normal        Cardiovascular negative cardio ROS Normal cardiovascular exam     Neuro/Psych negative neurological ROS     GI/Hepatic negative GI ROS, Neg liver ROS,   Endo/Other  negative endocrine ROS  Renal/GU negative Renal ROS  negative genitourinary   Musculoskeletal negative musculoskeletal ROS (+)   Abdominal   Peds  Hematology negative hematology ROS (+)   Anesthesia Other Findings   Reproductive/Obstetrics                           Anesthesia Physical Anesthesia Plan  ASA: I and emergent  Anesthesia Plan: General   Post-op Pain Management:    Induction: Intravenous  PONV Risk Score and Plan: 2 and Ondansetron, Dexamethasone, Midazolam and Treatment may vary due to age or medical condition  Airway Management Planned: LMA  Additional Equipment: None  Intra-op Plan:   Post-operative Plan: Extubation in OR  Informed Consent: I have reviewed the patients History and Physical, chart, labs and discussed the procedure including the risks, benefits and alternatives for the proposed anesthesia with the patient or authorized representative who has indicated his/her understanding and acceptance.     Dental advisory given  Plan Discussed with:   Anesthesia Plan Comments: ( Patient has chronic chest pain x1 year that comes on randomly. He often works out to try to relieve the pain, so his exercise tolerance is good. EKG reads as "acute pericarditis." He was recently seen in  the ED for this and they ruled out ACS and discharged him. No family history of early onset cardiac disease. Considering his age, exercise tolerance, and urgent surgery, it should be safe to proceed.)       Anesthesia Quick Evaluation
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
## 2020-08-20 NOTE — Discharge Instructions (Signed)
Please do not move your fingers-please keep your bandages clean and dry-you may use your index and thumb for bathroom eating and hygiene purposes.  Please be very careful.  Please do not move your fingers as mentioned.  Please call for any problems.We recommend that you to take vitamin C 1000 mg a day to promote healing. We also recommend that if you require  pain medicine that you take a stool softener to prevent constipation as most pain medicines will have constipation side effects. We recommend either Peri-Colace or Senokot and recommend that you also consider adding MiraLAX as well to prevent the constipation affects from pain medicine if you are required to use them. These medicines are over the counter and may be purchased at a local pharmacy. A cup of yogurt and a probiotic can also be helpful during the recovery process as the medicines can disrupt your intestinal environment.

## 2020-08-20 NOTE — Transfer of Care (Signed)
Immediate Anesthesia Transfer of Care Note  Patient: Ricky Kim  Procedure(s) Performed: Right hand irrigation and debridement with flexor tendon repair and exploration index middle ring and small finger with repair as necessary.  Left hand irrigation and debridement of multiple lacerations with flexor tendon repair left ring finger.  Neuroplasty and repair reconstruction is necessary. (Bilateral )  Patient Location: PACU  Anesthesia Type:General  Level of Consciousness: drowsy  Airway & Oxygen Therapy: Patient Spontanous Breathing and Patient connected to nasal cannula oxygen  Post-op Assessment: Report given to RN and Post -op Vital signs reviewed and stable  Post vital signs: Reviewed and stable  Last Vitals:  Vitals Value Taken Time  BP 171/74 08/20/20 1655  Temp    Pulse 100 08/20/20 1657  Resp 17 08/20/20 1657  SpO2 98 % 08/20/20 1657  Vitals shown include unvalidated device data.  Last Pain:  Vitals:   08/20/20 1209  TempSrc:   PainSc: 9       Patients Stated Pain Goal: 9 (08/20/20 1209)  Complications: No complications documented.
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
## 2020-08-20 NOTE — Anesthesia Procedure Notes (Addendum)
Procedure Name: Intubation Date/Time: 08/20/2020 1:28 PM Performed by: Colin Benton, CRNA Pre-anesthesia Checklist: Patient identified, Emergency Drugs available, Suction available and Patient being monitored Patient Re-evaluated:Patient Re-evaluated prior to induction Oxygen Delivery Method: Circle system utilized Preoxygenation: Pre-oxygenation with 100% oxygen Induction Type: IV induction Ventilation: Mask ventilation without difficulty Laryngoscope Size: Mac and 4 Grade View: Grade I Tube type: Oral Tube size: 7.5 mm Number of attempts: 1 Airway Equipment and Method: Stylet Placement Confirmation: ETT inserted through vocal cords under direct vision,  positive ETCO2 and breath sounds checked- equal and bilateral Secured at: 23 (At lip) cm Tube secured with: Tape Dental Injury: Teeth and Oropharynx as per pre-operative assessment

## 2020-08-21 NOTE — Anesthesia Postprocedure Evaluation (Signed)
Anesthesia Post Note  Patient: Ricky Kim  Procedure(s) Performed: Right hand irrigation and debridement with flexor tendon repair and exploration index middle ring and small finger with repair as necessary.  Left hand irrigation and debridement of multiple lacerations with flexor tendon repair left ring finger.  Neuroplasty and repair reconstruction is necessary. (Bilateral )     Patient location during evaluation: PACU Anesthesia Type: General Level of consciousness: awake and alert Pain management: pain level controlled Vital Signs Assessment: post-procedure vital signs reviewed and stable Respiratory status: spontaneous breathing, nonlabored ventilation, respiratory function stable and patient connected to nasal cannula oxygen Cardiovascular status: blood pressure returned to baseline and stable Postop Assessment: no apparent nausea or vomiting Anesthetic complications: no   No complications documented.  Last Vitals:  Vitals:   08/20/20 1726 08/20/20 1740  BP: (!) 157/88 (!) 145/92  Pulse: 95 83  Resp: 20 19  Temp:  (!) 36.2 C  SpO2: 99% 99%    Last Pain:  Vitals:   08/20/20 1726  TempSrc:   PainSc: 0-No pain                 Kennieth Rad

## 2020-08-22 ENCOUNTER — Encounter (HOSPITAL_COMMUNITY): Payer: Self-pay | Admitting: Orthopedic Surgery

## 2020-08-29 DIAGNOSIS — Z419 Encounter for procedure for purposes other than remedying health state, unspecified: Secondary | ICD-10-CM | POA: Diagnosis not present

## 2020-09-01 ENCOUNTER — Ambulatory Visit: Payer: Medicaid Other | Admitting: Occupational Therapy

## 2020-09-02 ENCOUNTER — Ambulatory Visit: Payer: Medicaid Other | Admitting: Occupational Therapy

## 2020-09-02 ENCOUNTER — Encounter: Payer: Medicaid Other | Admitting: Occupational Therapy

## 2020-09-08 ENCOUNTER — Ambulatory Visit: Payer: Medicaid Other | Admitting: Occupational Therapy

## 2020-09-15 ENCOUNTER — Encounter (HOSPITAL_COMMUNITY): Payer: Medicaid Other | Admitting: Occupational Therapy

## 2020-09-25 ENCOUNTER — Emergency Department (HOSPITAL_COMMUNITY)
Admission: EM | Admit: 2020-09-25 | Discharge: 2020-09-25 | Disposition: A | Discharge status: Home / Self Care | Payer: Medicaid Other | Attending: Emergency Medicine | Admitting: Emergency Medicine

## 2020-09-25 ENCOUNTER — Encounter (HOSPITAL_COMMUNITY): Payer: Self-pay

## 2020-09-25 ENCOUNTER — Other Ambulatory Visit: Payer: Self-pay | Admitting: Emergency Medicine

## 2020-09-25 ENCOUNTER — Other Ambulatory Visit: Payer: Self-pay

## 2020-09-25 DIAGNOSIS — F17210 Nicotine dependence, cigarettes, uncomplicated: Secondary | ICD-10-CM | POA: Diagnosis not present

## 2020-09-25 DIAGNOSIS — N342 Other urethritis: Secondary | ICD-10-CM | POA: Diagnosis not present

## 2020-09-25 DIAGNOSIS — R300 Dysuria: Secondary | ICD-10-CM | POA: Diagnosis present

## 2020-09-25 MED ORDER — LIDOCAINE HCL (PF) 1 % IJ SOLN
INTRAMUSCULAR | Status: AC
  Filled 2020-09-25: qty 2

## 2020-09-25 MED ORDER — DOXYCYCLINE HYCLATE 100 MG PO CAPS: 100.0000 mg | ORAL_CAPSULE | Freq: Two times a day (BID) | ORAL | 0 refills | Status: AC

## 2020-09-25 MED ORDER — CEFTRIAXONE SODIUM 500 MG IJ SOLR
500.0000 mg | INTRAMUSCULAR | Status: DC
  Administered 2020-09-25: 500 mg via INTRAMUSCULAR
  Filled 2020-09-25: qty 500

## 2020-09-25 NOTE — ED Provider Notes (Signed)
[facility] EMERGENCY DEPARTMENT Provider Note   CSN: [id number] Arrival date & time: 09/25/20  1146     History Chief Complaint  Patient presents with  . Dysuria    Ricky Kim. is a 20 y.o. male.  The history is provided by the patient. No language interpreter was used.  Dysuria Presenting symptoms: dysuria and penile discharge   Relieved by:  Nothing Worsened by:  Nothing Ineffective treatments:  None tried Risk factors: STI exposure        History reviewed. No pertinent past medical history.  There are no problems to display for this patient.   History reviewed. No pertinent surgical history.     History reviewed. No pertinent family history.  Social History   Tobacco Use  . Smoking status: Current Every Day Smoker    Types: Cigarettes, Cigars  . Smokeless tobacco: Never Used  Vaping Use  . Vaping Use: Never used  Substance Use Topics  . Alcohol use: Yes  . Drug use: Yes    Types: Marijuana    Home Medications Prior to Admission medications   Not on File    Allergies    Patient has no known allergies.  Review of Systems   Review of Systems  Genitourinary: Positive for dysuria and penile discharge.  All other systems reviewed and are negative.   Physical Exam Updated Vital Signs BP 130/88 (BP Location: Right Arm)   Pulse 67   Temp 98 F (36.7 C) (Oral)   Resp 18   Ht 6' (1.829 m)   Wt 74.8 kg   SpO2 100%   BMI 22.38 kg/m   Physical Exam Vitals and nursing note reviewed.  Constitutional:      Appearance: He is well-developed.  HENT:     Head: Normocephalic.  Pulmonary:     Effort: Pulmonary effort is normal.  Abdominal:     General: There is no distension.  Musculoskeletal:        General: Normal range of motion.     Cervical back: Normal range of motion.  Neurological:     General: No focal deficit present.     Mental Status: He is alert and oriented to person, place, and time.  Psychiatric:        Mood and Affect:  Mood normal.     ED Results / Procedures / Treatments   Labs (all labs ordered are listed, but only abnormal results are displayed) Labs Reviewed  GC/CHLAMYDIA PROBE AMP ([place]) NOT AT [facility]    EKG None  Radiology No results found.  Procedures Procedures   Medications Ordered in ED Medications  cefTRIAXone (ROCEPHIN) injection 500 mg (has no administration in time range)    ED Course  I have reviewed the triage vital signs and the nursing notes.  Pertinent labs & imaging results that were available during my care of the patient were reviewed by me and considered in my medical decision making (see chart for details).    MDM Rules/Calculators/A&P                          gc and ct pending.   Final Clinical Impression(s) / ED Diagnoses Final diagnoses:  Urethritis    Rx / DC Orders ED Discharge Orders         Ordered    doxycycline (VIBRAMYCIN) 100 MG capsule  2 times daily        09/25/20 1250  An After Visit Summary was printed and given to the patient.    Elson Areas, [place] 09/25/20 1251    Long, Arlyss Repress, MD 09/26/20 [phone]

## 2020-09-25 NOTE — ED Triage Notes (Signed)
Pt to er, pt states that he is here for burning with urination, states that he has unprotected sex, states that he has some drainage coming out of his penis, states that the drainage is white.

## 2020-09-27 ENCOUNTER — Telehealth: Payer: Self-pay

## 2020-09-27 NOTE — Telephone Encounter (Signed)
Transition Care Management Follow-up Telephone Call  Date of discharge and from where: 09/25/20 from [facility]  How have you been since you were released from the hospital? Pt stated that he is feeling well and has not questions or concerns at this time.   Any questions or concerns? No  Items Reviewed:  Did the pt receive and understand the discharge instructions provided? Yes   Medications obtained and verified? Yes   Other? No   Any new allergies since your discharge? No   Dietary orders reviewed? n/a  Do you have support at home? Yes   Functional Questionnaire: (I = Independent and D = Dependent) ADLs: I  Bathing/Dressing- I  Meal Prep- I  Eating- I  Maintaining continence- I  Transferring/Ambulation- I  Managing Meds- I   Follow up appointments reviewed:   PCP Hospital f/u appt confirmed? No    Specialist Hospital f/u appt confirmed? Yes  rehabilitation appt on 10/06/20  Are transportation arrangements needed? No   If their condition worsens, is the pt aware to call PCP or go to the Emergency Dept.? Yes  Was the patient provided with contact information for the PCP's office or ED? Yes  Was to pt encouraged to call back with questions or concerns? Yes

## 2020-09-28 LAB — MOLECULAR ANCILLARY ONLY
Chlamydia: NEGATIVE
Comment: NEGATIVE
Comment: NORMAL
Neisseria Gonorrhea: NEGATIVE

## 2020-09-29 DIAGNOSIS — Z419 Encounter for procedure for purposes other than remedying health state, unspecified: Secondary | ICD-10-CM | POA: Diagnosis not present

## 2020-10-06 ENCOUNTER — Ambulatory Visit (HOSPITAL_COMMUNITY): Payer: Medicaid Other | Attending: Orthopedic Surgery | Admitting: Occupational Therapy

## 2020-10-06 ENCOUNTER — Encounter (HOSPITAL_COMMUNITY): Payer: Self-pay | Admitting: Occupational Therapy

## 2020-10-09 ENCOUNTER — Other Ambulatory Visit: Payer: Self-pay

## 2020-10-09 ENCOUNTER — Emergency Department (HOSPITAL_COMMUNITY): Admission: EM | Admit: 2020-10-09 | Payer: Medicaid Other | Source: Home / Self Care

## 2020-10-09 ENCOUNTER — Emergency Department (HOSPITAL_COMMUNITY)
Admission: EM | Admit: 2020-10-09 | Discharge: 2020-10-09 | Disposition: A | Discharge status: Home / Self Care | Payer: Medicaid Other | Attending: Emergency Medicine | Admitting: Emergency Medicine

## 2020-10-09 ENCOUNTER — Encounter (HOSPITAL_COMMUNITY): Payer: Self-pay | Admitting: *Deleted

## 2020-10-09 DIAGNOSIS — F17210 Nicotine dependence, cigarettes, uncomplicated: Secondary | ICD-10-CM | POA: Diagnosis not present

## 2020-10-09 DIAGNOSIS — R300 Dysuria: Secondary | ICD-10-CM | POA: Diagnosis not present

## 2020-10-09 LAB — URINALYSIS, ROUTINE W REFLEX MICROSCOPIC
Bilirubin Urine: NEGATIVE
Glucose, UA: NEGATIVE mg/dL
Hgb urine dipstick: NEGATIVE
Ketones, ur: NEGATIVE mg/dL
Leukocytes,Ua: NEGATIVE
Nitrite: NEGATIVE
Protein, ur: NEGATIVE mg/dL
Specific Gravity, Urine: 1.018 (ref 1.005–1.030)
pH: 7 (ref 5.0–8.0)

## 2020-10-09 MED ORDER — DOXYCYCLINE HYCLATE 100 MG PO CAPS: 100.0000 mg | ORAL_CAPSULE | Freq: Two times a day (BID) | ORAL | 0 refills | Status: AC

## 2020-10-09 MED ORDER — CEFTRIAXONE SODIUM 500 MG IJ SOLR
500.0000 mg | Freq: Once | INTRAMUSCULAR | Status: AC
  Administered 2020-10-09: 500 mg via INTRAMUSCULAR
  Filled 2020-10-09: qty 500

## 2020-10-09 MED ORDER — DOXYCYCLINE HYCLATE 100 MG PO TABS
100.0000 mg | ORAL_TABLET | Freq: Once | ORAL | Status: AC
  Administered 2020-10-09: 100 mg via ORAL
  Filled 2020-10-09: qty 1

## 2020-10-09 MED ORDER — LIDOCAINE HCL (PF) 1 % IJ SOLN
INTRAMUSCULAR | Status: AC
  Filled 2020-10-09: qty 2

## 2020-10-09 NOTE — ED Notes (Signed)
Pt sitting up in bed, pt denies pain, denies hives or itchiness at injection site, states that he is ready to go home.

## 2020-10-09 NOTE — ED Triage Notes (Signed)
Pt with continued burning with urination, itching to penis and continued discharge.

## 2020-10-09 NOTE — Discharge Instructions (Addendum)
It was a pleasure taking care of you today. As discussed, your urine did not show any signs of infection. You were treated for gonorrhea and chlamydia.  Your results are pending.  Results should be available within the next few days.  I am sending you home with an antibiotic.  Take 2 times a day for the next 10 days.  Continue to use protection during intercourse.  Follow-up with PCP if symptoms do not improve within the next week.  Return to the ER for new or worsening symptoms.

## 2020-10-09 NOTE — ED Provider Notes (Signed)
[facility] EMERGENCY DEPARTMENT Provider Note   CSN: [id number] Arrival date & time: 10/09/20  1508     History Chief Complaint  Patient presents with   Dysuria    Ricky Lang. is a 20 y.o. male with no significant past medical history who presents to the ED due to dysuria and "bumps" on head of penis that has been persistent for the past few weeks.  Chart reviewed.  Patient was seen on 5/28 for the same complaint and discharged with doxycycline and given rocephin in the ED to prophylactically treat for gonorrhea and chlamydia. Patient states symptoms initially improved however, returned over the past few days.  Patient denies penile discharge.  He admits to pruritus to his penis.  Denies history of herpes.  Denies any known STI exposures.  Patient is currently sexually active with multiple partners without protection.  Denies fever and chills.  Denies abdominal pain, difficulties urinating, pain with defecation. Denies testicular pain. No treatment prior to arrival.   History obtained from patient and past medical records. No interpreter used during encounter.       History reviewed. No pertinent past medical history.  There are no problems to display for this patient.   History reviewed. No pertinent surgical history.     History reviewed. No pertinent family history.  Social History   Tobacco Use   Smoking status: Every Day    Pack years: 0.00    Types: Cigarettes, Cigars   Smokeless tobacco: Never  Vaping Use   Vaping Use: Never used  Substance Use Topics   Alcohol use: Yes   Drug use: Yes    Types: Marijuana    Home Medications Prior to Admission medications   Medication Sig Start Date End Date Taking? Authorizing Provider  doxycycline (VIBRAMYCIN) 100 MG capsule Take 1 capsule (100 mg total) by mouth 2 (two) times daily. 10/09/20  Yes Tammra Pressman, Merla Riches, PA-C  doxycycline (VIBRAMYCIN) 100 MG capsule Take 1 capsule (100 mg total) by mouth 2 (two) times  daily. 09/25/20   Elson Areas, PA-C    Allergies    Patient has no known allergies.  Review of Systems   Review of Systems  Constitutional:  Negative for chills and fever.  Gastrointestinal:  Negative for abdominal pain.  Genitourinary:  Positive for dysuria and genital sores. Negative for penile discharge, penile swelling and testicular pain.  All other systems reviewed and are negative.  Physical Exam Updated Vital Signs BP 122/75 (BP Location: Right Arm)   Pulse 63   Temp 97.8 F (36.6 C) (Oral)   Resp 14   Ht 6' (1.829 m)   Wt 74.8 kg   SpO2 96%   BMI 22.38 kg/m   Physical Exam Vitals and nursing note reviewed.  Constitutional:      General: He is not in acute distress.    Appearance: He is not ill-appearing.  HENT:     Head: Normocephalic.  Eyes:     Pupils: Pupils are equal, round, and reactive to light.  Cardiovascular:     Rate and Rhythm: Normal rate and regular rhythm.     Pulses: Normal pulses.     Heart sounds: Normal heart sounds. No murmur heard.   No friction rub. No gallop.  Pulmonary:     Effort: Pulmonary effort is normal.     Breath sounds: Normal breath sounds.  Abdominal:     General: Abdomen is flat. There is no distension.     Palpations: Abdomen is  soft.     Tenderness: There is no abdominal tenderness. There is no guarding or rebound.  Genitourinary:    Comments: Exam performed with chaperone in room.  No visible lesions to penis.  No penile discharge.  Uncircumcised penis.  No testicular tenderness or edema. Musculoskeletal:        General: Normal range of motion.     Cervical back: Neck supple.  Skin:    General: Skin is warm and dry.  Neurological:     General: No focal deficit present.     Mental Status: He is alert.  Psychiatric:        Mood and Affect: Mood normal.        Behavior: Behavior normal.    ED Results / Procedures / Treatments   Labs (all labs ordered are listed, but only abnormal results are displayed) Labs  Reviewed  URINALYSIS, ROUTINE W REFLEX MICROSCOPIC  GC/CHLAMYDIA PROBE AMP ([place]) NOT AT [facility]    EKG None  Radiology No results found.  Procedures Procedures   Medications Ordered in ED Medications  cefTRIAXone (ROCEPHIN) injection 500 mg (has no administration in time range)  lidocaine (PF) (XYLOCAINE) 1 % injection (has no administration in time range)  doxycycline (VIBRA-TABS) tablet 100 mg (100 mg Oral Given 10/09/20 1732)    ED Course  I have reviewed the triage vital signs and the nursing notes.  Pertinent labs & imaging results that were available during my care of the patient were reviewed by me and considered in my medical decision making (see chart for details).    MDM Rules/Calculators/A&P                         20 year old male presents to the ED due to dysuria and penile lesions for the past few days.  Patient was evaluated on 5/28 for the same complaint and discharged with doxycycline.  Patient states he is compliant with medications and symptoms improved however, returned for the past few days. Patient is afebrile without abdominal tenderness, abdominal pain or painful bowel movements to indicate prostatitis.  No tenderness to palpation of the testes or epididymis to suggest orchitis or epididymitis.  Patient also complaining of lesions to penis; however on exam no visible lesions. Low suspicion for current HSV outbreak. STD cultures obtained including gonorrhea and chlamydia. Patient to be discharged with instructions to follow up with PCP. Discussed importance of using protection when sexually active. Pt understands that they have GC/Chlamydia cultures pending and that they will need to inform all sexual partners if results return positive. Patient treated with Rocephin and doxycycline. Strict ED precautions discussed with patient. Patient states understanding and agrees to plan. Patient discharged home in no acute distress and stable vitals.  Final Clinical  Impression(s) / ED Diagnoses Final diagnoses:  Dysuria    Rx / DC Orders ED Discharge Orders          Ordered    doxycycline (VIBRAMYCIN) 100 MG capsule  2 times daily        10/09/20 1709             Jesusita Oka 10/09/20 1735    Eber Hong, MD 10/11/20 [phone]

## 2020-10-11 LAB — GC/CHLAMYDIA PROBE AMP (~~LOC~~) NOT AT ARMC
Chlamydia: NEGATIVE
Comment: NEGATIVE
Comment: NORMAL
Neisseria Gonorrhea: NEGATIVE

## 2020-10-29 DIAGNOSIS — Z419 Encounter for procedure for purposes other than remedying health state, unspecified: Secondary | ICD-10-CM | POA: Diagnosis not present

## 2020-11-29 DIAGNOSIS — Z419 Encounter for procedure for purposes other than remedying health state, unspecified: Secondary | ICD-10-CM | POA: Diagnosis not present

## 2020-12-17 DIAGNOSIS — J069 Acute upper respiratory infection, unspecified: Secondary | ICD-10-CM | POA: Diagnosis not present

## 2020-12-17 DIAGNOSIS — N3000 Acute cystitis without hematuria: Secondary | ICD-10-CM | POA: Diagnosis not present

## 2020-12-17 DIAGNOSIS — Z20822 Contact with and (suspected) exposure to covid-19: Secondary | ICD-10-CM | POA: Diagnosis not present

## 2020-12-23 DIAGNOSIS — R42 Dizziness and giddiness: Secondary | ICD-10-CM | POA: Diagnosis not present

## 2020-12-23 DIAGNOSIS — R059 Cough, unspecified: Secondary | ICD-10-CM | POA: Diagnosis not present

## 2020-12-23 DIAGNOSIS — F1290 Cannabis use, unspecified, uncomplicated: Secondary | ICD-10-CM | POA: Diagnosis not present

## 2020-12-23 DIAGNOSIS — R0989 Other specified symptoms and signs involving the circulatory and respiratory systems: Secondary | ICD-10-CM | POA: Diagnosis not present

## 2020-12-24 ENCOUNTER — Telehealth: Payer: Self-pay

## 2020-12-24 NOTE — Telephone Encounter (Signed)
Transition Care Management Unsuccessful Follow-up Telephone Call  Date of discharge and from where:  12/23/2020[facility]   Attempts:  1st Attempt  Reason for unsuccessful TCM follow-up call:  Unable to reach patient

## 2020-12-25 ENCOUNTER — Encounter: Payer: Self-pay | Admitting: Emergency Medicine

## 2020-12-25 ENCOUNTER — Ambulatory Visit
Admission: EM | Admit: 2020-12-25 | Discharge: 2020-12-25 | Disposition: A | Discharge status: Home / Self Care | Payer: Medicaid Other

## 2020-12-25 ENCOUNTER — Other Ambulatory Visit: Payer: Self-pay

## 2020-12-25 DIAGNOSIS — A549 Gonococcal infection, unspecified: Secondary | ICD-10-CM | POA: Diagnosis not present

## 2020-12-25 NOTE — ED Triage Notes (Addendum)
Pt wants his heart rate checked, denies any pain.  Does not want to be seen by provider.

## 2020-12-26 NOTE — Telephone Encounter (Signed)
Transition Care Management Unsuccessful Follow-up Telephone Call  Date of discharge and from where:  12/23/2020 from [facility]  Attempts:  2nd Attempt  Reason for unsuccessful TCM follow-up call:  Left voice message

## 2020-12-28 DIAGNOSIS — A549 Gonococcal infection, unspecified: Secondary | ICD-10-CM | POA: Diagnosis not present

## 2020-12-30 DIAGNOSIS — Z419 Encounter for procedure for purposes other than remedying health state, unspecified: Secondary | ICD-10-CM | POA: Diagnosis not present

## 2021-01-29 DIAGNOSIS — Z419 Encounter for procedure for purposes other than remedying health state, unspecified: Secondary | ICD-10-CM | POA: Diagnosis not present

## 2021-02-15 DIAGNOSIS — F419 Anxiety disorder, unspecified: Secondary | ICD-10-CM | POA: Diagnosis not present

## 2021-02-15 DIAGNOSIS — F32A Depression, unspecified: Secondary | ICD-10-CM | POA: Diagnosis not present

## 2021-02-15 DIAGNOSIS — G8929 Other chronic pain: Secondary | ICD-10-CM | POA: Diagnosis not present

## 2021-02-15 DIAGNOSIS — R634 Abnormal weight loss: Secondary | ICD-10-CM | POA: Diagnosis not present

## 2021-02-22 DIAGNOSIS — Z131 Encounter for screening for diabetes mellitus: Secondary | ICD-10-CM | POA: Diagnosis not present

## 2021-02-22 DIAGNOSIS — Z1159 Encounter for screening for other viral diseases: Secondary | ICD-10-CM | POA: Diagnosis not present

## 2021-02-22 DIAGNOSIS — G8929 Other chronic pain: Secondary | ICD-10-CM | POA: Diagnosis not present

## 2021-02-22 DIAGNOSIS — M2550 Pain in unspecified joint: Secondary | ICD-10-CM | POA: Diagnosis not present

## 2021-02-22 DIAGNOSIS — Z1329 Encounter for screening for other suspected endocrine disorder: Secondary | ICD-10-CM | POA: Diagnosis not present

## 2021-02-22 DIAGNOSIS — Z13220 Encounter for screening for lipoid disorders: Secondary | ICD-10-CM | POA: Diagnosis not present

## 2021-02-22 DIAGNOSIS — Z23 Encounter for immunization: Secondary | ICD-10-CM | POA: Diagnosis not present

## 2021-02-22 DIAGNOSIS — Z0000 Encounter for general adult medical examination without abnormal findings: Secondary | ICD-10-CM | POA: Diagnosis not present

## 2021-02-27 DIAGNOSIS — R21 Rash and other nonspecific skin eruption: Secondary | ICD-10-CM | POA: Diagnosis not present

## 2021-02-27 DIAGNOSIS — T7840XA Allergy, unspecified, initial encounter: Secondary | ICD-10-CM | POA: Diagnosis not present

## 2021-02-28 ENCOUNTER — Telehealth: Payer: Self-pay

## 2021-02-28 NOTE — Telephone Encounter (Signed)
Transition Care Management Follow-up Telephone Call Date of discharge and from where: 02/27/2021 from [facility] How have you been since you were released from the hospital? Pt stated that he is feeling better and did not have any questions at this time.  Any questions or concerns? No  Items Reviewed: Did the pt receive and understand the discharge instructions provided? Yes  Medications obtained and verified? Yes  Other? No  Any new allergies since your discharge? No  Dietary orders reviewed? No Do you have support at home? Yes   Functional Questionnaire: (I = Independent and D = Dependent) ADLs: I  Bathing/Dressing- I  Meal Prep- I  Eating- I  Maintaining continence- I  Transferring/Ambulation- I  Managing Meds- I   Follow up appointments reviewed:  PCP Hospital f/u appt confirmed? No   Specialist Hospital f/u appt confirmed? No   Are transportation arrangements needed? No  If their condition worsens, is the pt aware to call PCP or go to the Emergency Dept.? Yes Was the patient provided with contact information for the PCP's office or ED? Yes Was to pt encouraged to call back with questions or concerns? Yes

## 2021-03-13 DIAGNOSIS — Z872 Personal history of diseases of the skin and subcutaneous tissue: Secondary | ICD-10-CM | POA: Diagnosis not present

## 2021-03-13 DIAGNOSIS — R21 Rash and other nonspecific skin eruption: Secondary | ICD-10-CM | POA: Diagnosis not present

## 2021-03-15 DIAGNOSIS — M542 Cervicalgia: Secondary | ICD-10-CM | POA: Diagnosis not present

## 2021-03-15 DIAGNOSIS — R229 Localized swelling, mass and lump, unspecified: Secondary | ICD-10-CM | POA: Diagnosis not present

## 2021-03-15 DIAGNOSIS — K0889 Other specified disorders of teeth and supporting structures: Secondary | ICD-10-CM | POA: Diagnosis not present

## 2021-03-15 DIAGNOSIS — R599 Enlarged lymph nodes, unspecified: Secondary | ICD-10-CM | POA: Diagnosis not present

## 2021-03-15 DIAGNOSIS — R220 Localized swelling, mass and lump, head: Secondary | ICD-10-CM | POA: Diagnosis not present

## 2021-03-16 ENCOUNTER — Telehealth: Payer: Self-pay

## 2021-03-16 DIAGNOSIS — R590 Localized enlarged lymph nodes: Secondary | ICD-10-CM | POA: Diagnosis not present

## 2021-03-16 DIAGNOSIS — Z789 Other specified health status: Secondary | ICD-10-CM

## 2021-03-16 NOTE — Telephone Encounter (Signed)
Transition Care Management Unsuccessful Follow-up Telephone Call  Date of discharge and from where:  03/15/2021[facility]   Attempts:  1st Attempt  Reason for unsuccessful TCM follow-up call:  Unable to leave message

## 2021-03-17 ENCOUNTER — Telehealth: Payer: Self-pay

## 2021-03-17 NOTE — Telephone Encounter (Signed)
Transition Care Management Unsuccessful Follow-up Telephone Call  Date of discharge and from where:  03/16/2021 from [facility]   Attempts:  1st Attempt  Reason for unsuccessful TCM follow-up call:  Unable to leave message

## 2021-03-17 NOTE — Telephone Encounter (Signed)
Transition Care Management Unsuccessful Follow-up Telephone Call  Date of discharge and from where:  03/15/2021 from [facility]  Attempts:  2nd Attempt  Reason for unsuccessful TCM follow-up call:  Unable to leave message

## 2021-03-18 NOTE — Telephone Encounter (Signed)
Transition Care Management Unsuccessful Follow-up Telephone Call  Date of discharge and from where:  03/15/2021 from [facility]  Attempts:  3rd Attempt  Reason for unsuccessful TCM follow-up call:  Unable to reach patient

## 2021-03-18 NOTE — Telephone Encounter (Signed)
Transition Care Management Unsuccessful Follow-up Telephone Call  Date of discharge and from where:  03/16/2021 from [facility]  Attempts:  2nd Attempt  Reason for unsuccessful TCM follow-up call:  Unable to leave message

## 2021-03-21 NOTE — Telephone Encounter (Signed)
Transition Care Management Unsuccessful Follow-up Telephone Call  Date of discharge and from where:  03/16/2021 from [facility]  Attempts:  3rd Attempt  Reason for unsuccessful TCM follow-up call:  Unable to reach patient

## 2021-03-24 DIAGNOSIS — M25551 Pain in right hip: Secondary | ICD-10-CM | POA: Diagnosis not present

## 2021-03-24 DIAGNOSIS — M25552 Pain in left hip: Secondary | ICD-10-CM | POA: Diagnosis not present

## 2021-03-24 DIAGNOSIS — M25561 Pain in right knee: Secondary | ICD-10-CM | POA: Diagnosis not present

## 2021-03-24 DIAGNOSIS — M542 Cervicalgia: Secondary | ICD-10-CM | POA: Diagnosis not present

## 2021-03-24 DIAGNOSIS — M25562 Pain in left knee: Secondary | ICD-10-CM | POA: Diagnosis not present

## 2021-03-25 ENCOUNTER — Telehealth: Payer: Self-pay

## 2021-03-25 NOTE — Telephone Encounter (Signed)
Transition Care Management Unsuccessful Follow-up Telephone Call  Date of discharge and from where:  03/23/2021[facility]   Attempts:  1st Attempt  Reason for unsuccessful TCM follow-up call:  Unable to leave message

## 2021-03-28 DIAGNOSIS — M797 Fibromyalgia: Secondary | ICD-10-CM | POA: Diagnosis not present

## 2021-03-28 NOTE — Telephone Encounter (Signed)
Transition Care Management Unsuccessful Follow-up Telephone Call  Date of discharge and from where:  03/23/2021 from [facility]  Attempts:  2nd Attempt  Reason for unsuccessful TCM follow-up call:  Unable to leave message

## 2021-03-29 NOTE — Telephone Encounter (Signed)
Transition Care Management Unsuccessful Follow-up Telephone Call  Date of discharge and from where:  03/23/2021 from [facility]  Attempts:  3rd Attempt  Reason for unsuccessful TCM follow-up call:  Unable to reach patient

## 2021-03-31 DIAGNOSIS — F1290 Cannabis use, unspecified, uncomplicated: Secondary | ICD-10-CM | POA: Diagnosis not present

## 2021-03-31 DIAGNOSIS — M25562 Pain in left knee: Secondary | ICD-10-CM | POA: Diagnosis not present

## 2021-03-31 DIAGNOSIS — X501XXA Overexertion from prolonged static or awkward postures, initial encounter: Secondary | ICD-10-CM | POA: Diagnosis not present

## 2021-03-31 DIAGNOSIS — R102 Pelvic and perineal pain: Secondary | ICD-10-CM | POA: Diagnosis not present

## 2021-03-31 DIAGNOSIS — S76302A Unspecified injury of muscle, fascia and tendon of the posterior muscle group at thigh level, left thigh, initial encounter: Secondary | ICD-10-CM | POA: Diagnosis not present

## 2021-03-31 DIAGNOSIS — M79605 Pain in left leg: Secondary | ICD-10-CM | POA: Diagnosis not present

## 2021-04-03 DIAGNOSIS — K922 Gastrointestinal hemorrhage, unspecified: Secondary | ICD-10-CM | POA: Diagnosis not present

## 2021-04-03 DIAGNOSIS — Z79899 Other long term (current) drug therapy: Secondary | ICD-10-CM | POA: Diagnosis not present

## 2021-04-05 ENCOUNTER — Telehealth: Payer: Self-pay

## 2021-04-05 DIAGNOSIS — R109 Unspecified abdominal pain: Secondary | ICD-10-CM | POA: Diagnosis not present

## 2021-04-05 DIAGNOSIS — K529 Noninfective gastroenteritis and colitis, unspecified: Secondary | ICD-10-CM | POA: Diagnosis not present

## 2021-04-05 NOTE — Telephone Encounter (Signed)
Transition Care Management Unsuccessful Follow-up Telephone Call  Date of discharge and from where:  04/04/2021 from [facility]  Attempts:  1st Attempt  Reason for unsuccessful TCM follow-up call:  Left voice message

## 2021-04-06 NOTE — Telephone Encounter (Signed)
Transition Care Management Unsuccessful Follow-up Telephone Call  Date of discharge and from where:  04/04/2021 from [facility]  Attempts:  2nd Attempt  Reason for unsuccessful TCM follow-up call:  Voice mail full

## 2021-04-07 NOTE — Telephone Encounter (Signed)
Transition Care Management Unsuccessful Follow-up Telephone Call  Date of discharge and from where:  04/04/2021 from [facility]  Attempts:  3rd Attempt  Reason for unsuccessful TCM follow-up call:  Unable to reach patient

## 2021-04-14 ENCOUNTER — Encounter (INDEPENDENT_AMBULATORY_CARE_PROVIDER_SITE_OTHER): Payer: Self-pay | Admitting: *Deleted

## 2021-05-03 ENCOUNTER — Other Ambulatory Visit: Payer: Self-pay

## 2021-05-03 ENCOUNTER — Ambulatory Visit (INDEPENDENT_AMBULATORY_CARE_PROVIDER_SITE_OTHER): Payer: Medicaid Other | Admitting: Gastroenterology

## 2021-05-03 ENCOUNTER — Encounter (INDEPENDENT_AMBULATORY_CARE_PROVIDER_SITE_OTHER): Payer: Self-pay | Admitting: Gastroenterology

## 2021-05-03 VITALS — BP 117/78 | HR 72 | Temp 98.8°F | Ht 68.0 in | Wt 135.2 lb

## 2021-05-03 DIAGNOSIS — R197 Diarrhea, unspecified: Secondary | ICD-10-CM | POA: Insufficient documentation

## 2021-05-03 DIAGNOSIS — R1030 Lower abdominal pain, unspecified: Secondary | ICD-10-CM | POA: Diagnosis not present

## 2021-05-03 MED ORDER — DICYCLOMINE HCL 10 MG PO CAPS: 10.0000 mg | ORAL_CAPSULE | Freq: Three times a day (TID) | ORAL | 3 refills | Status: DC | PRN

## 2021-05-03 NOTE — Patient Instructions (Signed)
We will check stool studies to make sure there is no underlying infection causing your symptoms. 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  As discussed, I do not think we need to do a colonoscopy right now, given your age and lack of other alarming symptoms. If you develop any new GI symptoms such as rectal bleeding, more frequent diarrhea or worsening abdominal pain, please let me know.   Follow up 6 months

## 2021-05-03 NOTE — Progress Notes (Signed)
Referring Provider: Donetta Potts, MD Primary Care Physician:  Donetta Potts, MD Primary GI Physician: newly established  Chief Complaint  Patient presents with   Diarrhea    Patient here today with complaints of diarrhea and weight loss. He states symptoms on going for around two years. He states symptoms started after he had covid two years ago. Has lower abdominal pain and nausea. Has diarrhea alternating with constipation.  Patient complaints of joint pain all over his body.   HPI:   Ricky Kim is a 21 y.o. male with no significant past medical history.  Patient presenting today as a new patient for diarrhea and weight loss for the past 2 years since he had COVID, with associated lower abdominal cramping. He reports that he has lower abdominal cramping is not improved with BM but does not seemed worsened after eating. States that he has multiple BMs per day, usually after a meal, maybe 3-4 typically always softer-looser stools but not typically watery. He denies post prandial abdominal pain. He does have nausea that he reports occurs occasionally maybe if he works out. He states that his appetite is not great but reports he is trying to avoid certain foods that seem to make his symptoms worse. He is unable to pinpoint how much weight he has lost over the course of the past 2 years but feels that he looks thinner, though he does note that he works out. Patient denies melena, hematochezia, vomiting,  constipation, dysphagia, odyonophagia, or early satiety.  He reports that he has occasional acid reflux, usually only occurring when he works out, he takes pepcid 20mg  daily PRN.  NSAID use: occasional ibuprofen Social hx: no etoh, no tobacco Fam hx: no crc or liver disease  Last Colonoscopy:n/a Last Endoscopy:n/a  Past Medical History:  Diagnosis Date   Dyspnea    Staph infection    Past Surgical History:  Procedure Laterality Date   INCISION AND DRAINAGE WOUND WITH  TENDON REPAIR Bilateral 08/20/2020   Procedure: Right hand irrigation and debridement with flexor tendon repair and exploration index middle ring and small finger with repair as necessary.  Left hand irrigation and debridement of multiple lacerations with flexor tendon repair left ring finger.  Neuroplasty and repair reconstruction is necessary.;  Surgeon: 08/22/2020, MD;  Location: [facility] OR;  Service: Orthopedics;  Laterality: [place]    Current Outpatient Medications  Medication Sig Dispense Refill   amoxicillin-clavulanate (AUGMENTIN) 875-125 MG tablet Take 1 tablet by mouth 2 (two) times daily.     cyclobenzaprine (FLEXERIL) 10 MG tablet Take by mouth as needed.     DULoxetine (CYMBALTA) 30 MG capsule Take 30 mg by mouth daily.     famotidine (PEPCID) 20 MG tablet Take 20 mg by mouth daily.     ibuprofen (ADVIL) 800 MG tablet Take 800 mg by mouth every 8 (eight) hours as needed.     ondansetron (ZOFRAN-ODT) 4 MG disintegrating tablet SMARTSIG:1 Tablet(s) By Mouth Every 12 Hours PRN     oxyCODONE (OXY IR/ROXICODONE) 5 MG immediate release tablet oxycodone 5 mg tablet  Take 1 tablet every 4-6 hours by oral route as needed for pain for 7 days.     sertraline (ZOLOFT) 25 MG tablet Take 25 mg by mouth daily.     No current facility-administered medications for this visit.    Allergies as of 05/03/2021   (No Known Allergies)    Family History  Problem Relation Age of Onset   Healthy Mother  Social History   Socioeconomic History   Marital status: Single    Spouse name: Not on file   Number of children: Not on file   Years of education: Not on file   Highest education level: Not on file  Occupational History   Not on file  Tobacco Use   Smoking status: Never   Smokeless tobacco: Never  Vaping Use   Vaping Use: Former  Substance and Sexual Activity   Alcohol use: Never   Drug use: Yes    Types: Marijuana   Sexual activity: Not on file  Other Topics Concern   Not on file   Social History Narrative   ** Merged History Encounter **       Social Determinants of Health   Financial Resource Strain: Not on file  Food Insecurity: Not on file  Transportation Needs: Not on file  Physical Activity: Not on file  Stress: Not on file  Social Connections: Not on file   Review of systems General: negative for malaise, night sweats, fever, chills, +weight loss Neck: Negative for lumps, goiter, pain and significant neck swelling Resp: Negative for cough, wheezing, dyspnea at rest CV: Negative for chest pain, leg swelling, palpitations, orthopnea GI: denies melena, hematochezia, vomiting, constipation, dysphagia, odyonophagia, early satiety or unintentional weight loss. +nausea +diarrhea +lower abdominal pain MSK: Negative for joint pain or swelling, back pain, and muscle pain. Derm: Negative for itching or rash Psych: Denies depression, anxiety, memory loss, confusion. No homicidal or suicidal ideation.  Heme: Negative for prolonged bleeding, bruising easily, and swollen nodes. Endocrine: Negative for cold or heat intolerance, polyuria, polydipsia and goiter. Neuro: negative for tremor, gait imbalance, syncope and seizures. The remainder of the review of systems is noncontributory.  Physical Exam: BP 117/78 (BP Location: Left Arm, Patient Position: Sitting, Cuff Size: Small)    Pulse 72    Temp 98.8 F (37.1 C) (Oral)    Ht 5\' 8"  (1.727 m)    Wt 135 lb 3.2 oz (61.3 kg)    BMI 20.56 kg/m  General:   Alert and oriented. No distress noted. Pleasant and cooperative.  Head:  Normocephalic and atraumatic. Eyes:  Conjuctiva clear without scleral icterus. Mouth:  Oral mucosa pink and moist. Good dentition. No lesions. Heart: Normal rate and rhythm, s1 and s2 heart sounds present.  Lungs: Clear lung sounds in all lobes. Respirations equal and unlabored. Abdomen:  +BS, soft,  and non-distended. Mild TTP of lower mid abdomen No rebound or guarding. No HSM or masses  noted. Derm: No palmar erythema or jaundice Msk:  Symmetrical without gross deformities. Normal posture. Extremities:  Without edema. Neurologic:  Alert and  oriented x4 Psych:  Alert and cooperative. Normal mood and affect.  Invalid input(s): 6 MONTHS   ASSESSMENT: Ricky Kim is a 21 y.o. male presenting today for ongoing diarrhea, weight loss and lower abdominal pain x2 years since having COVID.  Patient reports 3-4 soft to loose stools per day since having COVID 2 years ago, he reports weight loss, however, reassuringly, in reviewing EMR, his weight appears to have remained between 132-137 lbs over the past 2 years. He is without alarm symptoms. 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 I have discussed indications of IBS with the patient as well as treatment and diet. No indications for colonoscopy at this time, however, if he develops  new or worsening GI symptoms, then we will readdress the need for endoscopic evaluation. I am sending bentyl 10mg  TID for him to try and information on low FODMAP diet as well.   PLAN:  GI path panel 2. Bentyl 10mg  TID PRN 3. Low FODMAP diet   Follow Up: 6 months  Waqas Bruhl L. , MSN, APRN, AGNP-C Adult-Gerontology Nurse Practitioner [facility]

## 2021-05-12 ENCOUNTER — Emergency Department (HOSPITAL_COMMUNITY)
Admission: EM | Admit: 2021-05-12 | Discharge: 2021-05-12 | Disposition: A | Discharge status: Home / Self Care | Payer: Medicaid Other | Attending: Emergency Medicine | Admitting: Emergency Medicine

## 2021-05-12 ENCOUNTER — Encounter (HOSPITAL_COMMUNITY): Payer: Self-pay | Admitting: *Deleted

## 2021-05-12 ENCOUNTER — Other Ambulatory Visit: Payer: Self-pay

## 2021-05-12 DIAGNOSIS — K029 Dental caries, unspecified: Secondary | ICD-10-CM | POA: Diagnosis not present

## 2021-05-12 DIAGNOSIS — K0889 Other specified disorders of teeth and supporting structures: Secondary | ICD-10-CM | POA: Diagnosis present

## 2021-05-12 MED ORDER — IBUPROFEN 600 MG PO TABS: 600.0000 mg | ORAL_TABLET | Freq: Four times a day (QID) | ORAL | 0 refills | Status: DC | PRN

## 2021-05-12 MED ORDER — AMOXICILLIN 500 MG PO CAPS: 500.0000 mg | ORAL_CAPSULE | Freq: Three times a day (TID) | ORAL | 0 refills | Status: DC

## 2021-05-12 MED ORDER — HYDROCODONE-ACETAMINOPHEN 5-325 MG PO TABS
1.0000 | ORAL_TABLET | ORAL | 0 refills | Status: DC | PRN
Start: 2021-05-12 — End: 2022-04-05

## 2021-05-12 NOTE — ED Triage Notes (Signed)
Pt with dental pain for over a year, pt to have extraction on 1/26. Pt believes there is infection.

## 2021-05-12 NOTE — ED Notes (Signed)
Pt d/c home per MD order. Discharge summary reviewed with pt, pt verbalizes understanding. Ambulatory off unit. No s/s of acute distress noted.  

## 2021-05-12 NOTE — ED Triage Notes (Signed)
Pt states pain radiates down throat to chest

## 2021-05-12 NOTE — ED Provider Notes (Signed)
[facility] EMERGENCY DEPARTMENT Provider Note   CSN: [id number] Arrival date & time: 05/12/21  1030     History  Chief Complaint  Patient presents with   Dental Pain    Ricky Kim is a 21 y.o. male.  Pt is a 21 yo bm with hx significant only for dental caries.  Pt has had pain in his teeth intermittently for 2 years.  He has an appt to get the bad teeth and his wisdom teeth removed on 1/26.  He said his teeth have been hurting for the past few days.  No fevers.      Home Medications Prior to Admission medications   Medication Sig Start Date End Date Taking? Authorizing Provider  amoxicillin (AMOXIL) 500 MG capsule Take 1 capsule (500 mg total) by mouth 3 (three) times daily. 05/12/21  Yes Jacalyn Lefevre, MD  HYDROcodone-acetaminophen (NORCO/VICODIN) 5-325 MG tablet Take 1 tablet by mouth every 4 (four) hours as needed. 05/12/21  Yes Jacalyn Lefevre, MD  ibuprofen (ADVIL) 600 MG tablet Take 1 tablet (600 mg total) by mouth every 6 (six) hours as needed. 05/12/21  Yes Jacalyn Lefevre, MD  cyclobenzaprine (FLEXERIL) 10 MG tablet Take by mouth as needed. 03/24/21   [provider]  dicyclomine (BENTYL) 10 MG capsule Take 1 capsule (10 mg total) by mouth 3 (three) times daily as needed for spasms. 05/03/21   Carlan, Chelsea L, NP  DULoxetine (CYMBALTA) 30 MG capsule Take 30 mg by mouth daily. 03/28/21   [provider]  famotidine (PEPCID) 20 MG tablet Take 20 mg by mouth daily. 04/05/21   [provider]  ondansetron (ZOFRAN-ODT) 4 MG disintegrating tablet SMARTSIG:1 Tablet(s) By Mouth Every 12 Hours PRN 04/05/21   [provider]  sertraline (ZOLOFT) 25 MG tablet Take 25 mg by mouth daily. 02/15/21   [provider]      Allergies    Patient has no known allergies.    Review of Systems   Review of Systems  HENT:  Positive for dental problem.   All other systems reviewed and are negative.  Physical Exam Updated Vital Signs BP  128/82 (BP Location: Right Arm)    Pulse 80    Temp 98.4 F (36.9 C) (Oral)    Resp 16    Ht 5\' 9"  (1.753 m)    Wt 61.2 kg    SpO2 100%    BMI 19.94 kg/m  Physical Exam Vitals and nursing note reviewed.  Constitutional:      Appearance: Normal appearance.  HENT:     Head: Normocephalic and atraumatic.     Comments: Dental caries noted     Right Ear: External ear normal.     Left Ear: External ear normal.     Nose: Nose normal.     Mouth/Throat:     Mouth: Mucous membranes are moist.     Pharynx: Oropharynx is clear.  Eyes:     Extraocular Movements: Extraocular movements intact.     Conjunctiva/sclera: Conjunctivae normal.     Pupils: Pupils are equal, round, and reactive to light.  Cardiovascular:     Rate and Rhythm: Normal rate and regular rhythm.     Pulses: Normal pulses.     Heart sounds: Normal heart sounds.  Pulmonary:     Effort: Pulmonary effort is normal.     Breath sounds: Normal breath sounds.  Abdominal:     General: Abdomen is flat. Bowel sounds are normal.     Palpations:  Abdomen is soft.  Musculoskeletal:        General: Normal range of motion.     Cervical back: Normal range of motion and neck supple.  Skin:    General: Skin is warm.     Capillary Refill: Capillary refill takes less than 2 seconds.  Neurological:     General: No focal deficit present.     Mental Status: He is alert and oriented to person, place, and time.  Psychiatric:        Mood and Affect: Mood normal.        Behavior: Behavior normal.    ED Results / Procedures / Treatments   Labs (all labs ordered are listed, but only abnormal results are displayed) Labs Reviewed - No data to display  EKG None  Radiology No results found.  Procedures Procedures    Medications Ordered in ED Medications - No data to display  ED Course/ Medical Decision Making/ A&P                           Medical Decision Making  Pt started on amox, ibuprofen, and a short course of lortab.  Pt  is stable for d/c.  Return if worse.  F/u with dentist as scheduled.        Final Clinical Impression(s) / ED Diagnoses Final diagnoses:  Dental caries    Rx / DC Orders ED Discharge Orders          Ordered    amoxicillin (AMOXIL) 500 MG capsule  3 times daily        05/12/21 1114    ibuprofen (ADVIL) 600 MG tablet  Every 6 hours PRN        05/12/21 1114    HYDROcodone-acetaminophen (NORCO/VICODIN) 5-325 MG tablet  Every 4 hours PRN        05/12/21 1114              Jacalyn Lefevre, MD 05/12/21 1119

## 2021-05-13 ENCOUNTER — Telehealth: Payer: Self-pay

## 2021-05-13 NOTE — Telephone Encounter (Signed)
Transition Care Management Unsuccessful Follow-up Telephone Call ° °Date of discharge and from where:  05/12/2021-[facility]  ° °Attempts:  1st Attempt ° °Reason for unsuccessful TCM follow-up call:  Left voice message ° °  °

## 2021-05-14 NOTE — Telephone Encounter (Signed)
Transition Care Management Unsuccessful Follow-up Telephone Call ° °Date of discharge and from where:  05/12/2021-[place]  ° °Attempts:  2nd Attempt ° °Reason for unsuccessful TCM follow-up call:  Left voice message ° °  °

## 2021-05-15 NOTE — Telephone Encounter (Signed)
Transition Care Management Unsuccessful Follow-up Telephone Call ° °Date of discharge and from where:  05/12/2021-[facility]  ° °Attempts:  3rd Attempt ° °Reason for unsuccessful TCM follow-up call:  Left voice message ° °  °

## 2021-05-19 DIAGNOSIS — R634 Abnormal weight loss: Secondary | ICD-10-CM | POA: Diagnosis not present

## 2021-05-19 DIAGNOSIS — R5383 Other fatigue: Secondary | ICD-10-CM | POA: Diagnosis not present

## 2021-05-19 DIAGNOSIS — M436 Torticollis: Secondary | ICD-10-CM | POA: Diagnosis not present

## 2021-05-19 DIAGNOSIS — M791 Myalgia, unspecified site: Secondary | ICD-10-CM | POA: Diagnosis not present

## 2021-05-19 DIAGNOSIS — R946 Abnormal results of thyroid function studies: Secondary | ICD-10-CM | POA: Diagnosis not present

## 2021-05-19 DIAGNOSIS — R531 Weakness: Secondary | ICD-10-CM | POA: Diagnosis not present

## 2021-05-19 DIAGNOSIS — R21 Rash and other nonspecific skin eruption: Secondary | ICD-10-CM | POA: Diagnosis not present

## 2021-05-19 DIAGNOSIS — Z20822 Contact with and (suspected) exposure to covid-19: Secondary | ICD-10-CM | POA: Diagnosis not present

## 2021-05-20 ENCOUNTER — Telehealth: Payer: Self-pay

## 2021-05-20 NOTE — Telephone Encounter (Signed)
Transition Care Management Unsuccessful Follow-up Telephone Call  Date of discharge and from where:  05/19/2021 from [facility]  Attempts:  1st Attempt  Reason for unsuccessful TCM follow-up call:  Unable to leave message

## 2021-05-23 NOTE — Telephone Encounter (Signed)
Transition Care Management Unsuccessful Follow-up Telephone Call  Date of discharge and from where:  05/19/2021-[facility]   Attempts:  2nd Attempt  Reason for unsuccessful TCM follow-up call:  Unable to leave message

## 2021-05-24 NOTE — Telephone Encounter (Signed)
Transition Care Management Unsuccessful Follow-up Telephone Call  Date of discharge and from where:  05/19/2021 from [facility]  Attempts:  3rd Attempt  Reason for unsuccessful TCM follow-up call:  Unable to reach patient

## 2021-06-01 DIAGNOSIS — R109 Unspecified abdominal pain: Secondary | ICD-10-CM | POA: Diagnosis not present

## 2021-06-01 DIAGNOSIS — F419 Anxiety disorder, unspecified: Secondary | ICD-10-CM | POA: Diagnosis not present

## 2021-06-01 DIAGNOSIS — R634 Abnormal weight loss: Secondary | ICD-10-CM | POA: Diagnosis not present

## 2021-06-01 DIAGNOSIS — R7989 Other specified abnormal findings of blood chemistry: Secondary | ICD-10-CM | POA: Diagnosis not present

## 2021-07-14 DIAGNOSIS — F1290 Cannabis use, unspecified, uncomplicated: Secondary | ICD-10-CM | POA: Diagnosis not present

## 2021-07-14 DIAGNOSIS — K0889 Other specified disorders of teeth and supporting structures: Secondary | ICD-10-CM | POA: Diagnosis not present

## 2021-07-19 ENCOUNTER — Telehealth: Payer: Self-pay

## 2021-07-19 NOTE — Telephone Encounter (Signed)
Transition Care Management Unsuccessful Follow-up Telephone Call ? ?Date of discharge and from where:  07/18/2021-[facility] ? ?Attempts:  1st Attempt ? ?Reason for unsuccessful TCM follow-up call:  Left voice message ? ?  ?

## 2021-07-20 NOTE — Telephone Encounter (Signed)
Transition Care Management Unsuccessful Follow-up Telephone Call ? ?Date of discharge and from where:  07/18/2021 from [facility] ? ?Attempts:  2nd Attempt ? ?Reason for unsuccessful TCM follow-up call:  Missing or invalid number ? ? ? ?

## 2021-07-21 NOTE — Telephone Encounter (Signed)
Transition Care Management Unsuccessful Follow-up Telephone Call ? ?Date of discharge and from where:  07/18/2021-[facility]  ? ?Attempts:  3rd Attempt ? ?Reason for unsuccessful TCM follow-up call:  Missing or invalid number ? ?  ?

## 2021-08-04 DIAGNOSIS — M25561 Pain in right knee: Secondary | ICD-10-CM | POA: Diagnosis not present

## 2021-08-04 DIAGNOSIS — Z202 Contact with and (suspected) exposure to infections with a predominantly sexual mode of transmission: Secondary | ICD-10-CM | POA: Diagnosis not present

## 2021-08-04 DIAGNOSIS — W19XXXA Unspecified fall, initial encounter: Secondary | ICD-10-CM | POA: Diagnosis not present

## 2021-08-04 DIAGNOSIS — J029 Acute pharyngitis, unspecified: Secondary | ICD-10-CM | POA: Diagnosis not present

## 2021-08-04 DIAGNOSIS — S8391XA Sprain of unspecified site of right knee, initial encounter: Secondary | ICD-10-CM | POA: Diagnosis not present

## 2021-08-05 ENCOUNTER — Telehealth: Payer: Self-pay

## 2021-08-05 NOTE — Telephone Encounter (Signed)
Transition Care Management Unsuccessful Follow-up Telephone Call ? ?Date of discharge and from where:  08/04/2021-[facility]  ? ?Attempts:  1st Attempt ? ?Reason for unsuccessful TCM follow-up call:  Missing or invalid number ? ?  ?

## 2021-08-08 NOTE — Telephone Encounter (Signed)
Transition Care Management Unsuccessful Follow-up Telephone Call ? ?Date of discharge and from where:  08/04/2021-[facility]  ? ?Attempts:  2nd Attempt ? ?Reason for unsuccessful TCM follow-up call:  Missing or invalid number ? ?  ?

## 2021-08-09 NOTE — Telephone Encounter (Signed)
Transition Care Management Unsuccessful Follow-up Telephone Call ? ?Date of discharge and from where:  08/04/2021-[facility]  ? ?Attempts:  3rd Attempt ? ?Reason for unsuccessful TCM follow-up call:  Missing or invalid number ? ?  ?

## 2021-11-03 ENCOUNTER — Ambulatory Visit (INDEPENDENT_AMBULATORY_CARE_PROVIDER_SITE_OTHER): Payer: Medicaid Other | Admitting: Gastroenterology

## 2021-11-03 ENCOUNTER — Encounter (INDEPENDENT_AMBULATORY_CARE_PROVIDER_SITE_OTHER): Payer: Self-pay | Admitting: Gastroenterology

## 2022-02-13 IMAGING — DX DG CHEST 1V PORT
1 series · 1 of 1 positions shown · non-contrast
Comparison: None

CLINICAL DATA: Chest pain and weight loss

EXAM:
PORTABLE CHEST 1 VIEW

[chest ap]
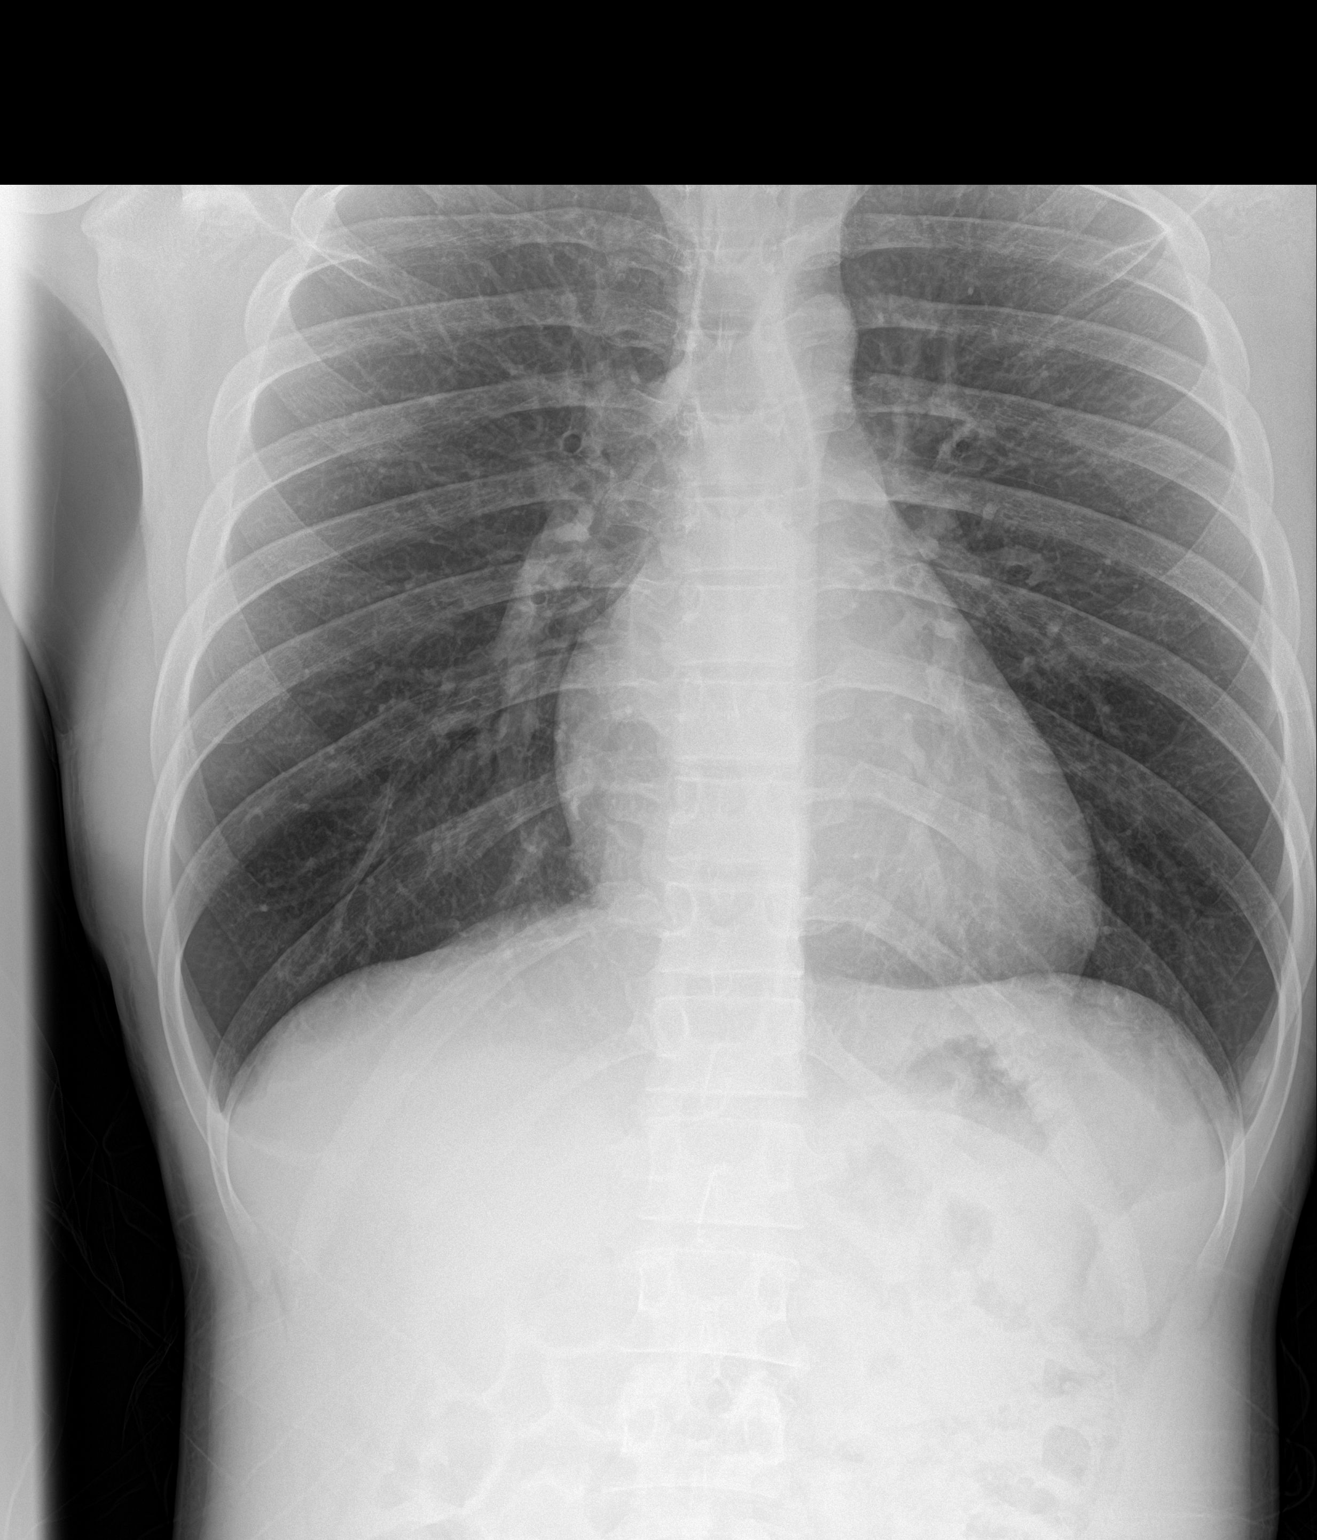

[1 of 1 positions shown; findings below may reference images not displayed]

FINDINGS: Lungs are clear. The heart size and pulmonary vascularity are
normal. No adenopathy. No pneumothorax. No bone lesions.
IMPRESSION: Lungs clear.  Cardiac silhouette normal.

## 2022-04-05 ENCOUNTER — Encounter (HOSPITAL_BASED_OUTPATIENT_CLINIC_OR_DEPARTMENT_OTHER): Payer: Self-pay

## 2022-04-05 ENCOUNTER — Emergency Department (HOSPITAL_BASED_OUTPATIENT_CLINIC_OR_DEPARTMENT_OTHER)
Admission: EM | Admit: 2022-04-05 | Discharge: 2022-04-05 | Disposition: A | Discharge status: Home / Self Care | Payer: Medicaid Other | Attending: Emergency Medicine | Admitting: Emergency Medicine

## 2022-04-05 ENCOUNTER — Other Ambulatory Visit: Payer: Self-pay

## 2022-04-05 DIAGNOSIS — N341 Nonspecific urethritis: Secondary | ICD-10-CM | POA: Insufficient documentation

## 2022-04-05 DIAGNOSIS — R300 Dysuria: Secondary | ICD-10-CM | POA: Diagnosis present

## 2022-04-05 DIAGNOSIS — N342 Other urethritis: Secondary | ICD-10-CM

## 2022-04-05 LAB — URINALYSIS, ROUTINE W REFLEX MICROSCOPIC
Bilirubin Urine: NEGATIVE
Glucose, UA: NEGATIVE mg/dL
Ketones, ur: NEGATIVE mg/dL
Nitrite: NEGATIVE
Specific Gravity, Urine: 1.036 — ABNORMAL HIGH (ref 1.005–1.030)
pH: 6.5 (ref 5.0–8.0)

## 2022-04-05 MED ORDER — DOXYCYCLINE HYCLATE 100 MG PO CAPS: 100.0000 mg | ORAL_CAPSULE | Freq: Two times a day (BID) | ORAL | 0 refills | Status: AC

## 2022-04-05 MED ORDER — PHENAZOPYRIDINE HCL 200 MG PO TABS: 200.0000 mg | ORAL_TABLET | Freq: Three times a day (TID) | ORAL | 0 refills | Status: DC

## 2022-04-05 MED ORDER — CEFTRIAXONE SODIUM 500 MG IJ SOLR
500.0000 mg | Freq: Once | INTRAMUSCULAR | Status: AC
  Administered 2022-04-05: 500 mg via INTRAMUSCULAR
  Filled 2022-04-05: qty 500

## 2022-04-05 NOTE — ED Triage Notes (Signed)
Patient BIB [facility] from Home.  Endorses Dysuria for 5 Days and Hematuria noted today.   VSS with EMS en Route. NAD Noted during Triage. A&Ox4. GCS 15. Ambulatory.

## 2022-04-05 NOTE — ED Provider Triage Note (Signed)
Emergency Medicine Provider Triage Evaluation Note  Ricky Kim , a 21 y.o. male  was evaluated in triage.  Pt complains of dysuria x 5 days, blood in urine at end of stream (dots of blood). Denies nausea, vomiting, fevers, chills, reports lower abdominal pain (years, nothing new about this- has been worked up extensively). 10/2021 STD negative. Sexually active, yes, no new partners.  Does not receive anal intercourse.  BROUGHT IN BY EMS Review of Systems  Positive: As above Negative: As above  Physical Exam  BP 118/68 (BP Location: Right Arm)   Pulse (!) 57   Temp 98 F (36.7 C)   Resp 16   SpO2 100%  Gen:   Awake, no distress   Resp:  Normal effort  MSK:   Moves extremities without difficulty  Other:  + urethral dc  Medical Decision Making  Medically screening exam initiated at 1:24 PM.  Appropriate orders placed.  Ricky Kim was informed that the remainder of the evaluation will be completed by another provider, this initial triage assessment does not replace that evaluation, and the importance of remaining in the ED until their evaluation is complete.     Jeannie Fend, PA-C 04/05/22 [phone]

## 2022-04-05 NOTE — ED Provider Notes (Signed)
[facility] EMERGENCY DEPT Provider Note   CSN: [id number] Arrival date & time: 04/05/22  1219     History  Chief Complaint  Patient presents with   Dysuria    Ricky Kim is a 21 y.o. male.  Pt complains of dysuria x 5 days, blood in urine at end of stream (dots of blood). Denies nausea, vomiting, fevers, chills, reports lower abdominal pain (years, nothing new about this- has been worked up extensively). 10/2021 treated for STD, symptoms improved, STD negative on results.  Sexually active, yes, no new partners.  Does not receive anal intercourse.  BROUGHT IN BY EMS        Home Medications Prior to Admission medications   Medication Sig Start Date End Date Taking? Authorizing Provider  doxycycline (VIBRAMYCIN) 100 MG capsule Take 1 capsule (100 mg total) by mouth 2 (two) times daily for 7 days. 04/05/22 04/12/22 Yes Tacy Learn, PA-C  phenazopyridine (PYRIDIUM) 200 MG tablet Take 1 tablet (200 mg total) by mouth 3 (three) times daily. 04/05/22  Yes Tacy Learn, PA-C  DULoxetine (CYMBALTA) 30 MG capsule Take 30 mg by mouth daily. 03/28/21   [provider]  famotidine (PEPCID) 20 MG tablet Take 20 mg by mouth daily. 04/05/21   [provider]  sertraline (ZOLOFT) 25 MG tablet Take 25 mg by mouth daily. 02/15/21   [provider]      Allergies    Patient has no known allergies.    Review of Systems   Review of Systems Negative except as per HPI Physical Exam Updated Vital Signs BP 118/68 (BP Location: Right Arm)   Pulse (!) 57   Temp 98 F (36.7 C)   Resp 16   Ht 5\' 9"  (1.753 m)   Wt 61.2 kg   SpO2 100%   BMI 19.92 kg/m  Physical Exam Vitals and nursing note reviewed.  Constitutional:      General: He is not in acute distress.    Appearance: He is well-developed. He is not diaphoretic.  HENT:     Head: Normocephalic and atraumatic.  Pulmonary:     Effort: Pulmonary effort is normal.  Abdominal:      Palpations: Abdomen is soft.     Tenderness: There is no abdominal tenderness.  Genitourinary:    Penis: Uncircumcised. Discharge present.   Skin:    General: Skin is warm and dry.     Findings: No erythema or rash.  Neurological:     Mental Status: He is alert and oriented to person, place, and time.  Psychiatric:        Behavior: Behavior normal.     ED Results / Procedures / Treatments   Labs (all labs ordered are listed, but only abnormal results are displayed) Labs Reviewed  URINALYSIS, ROUTINE W REFLEX MICROSCOPIC - Abnormal; Notable for the following components:      Result Value   Specific Gravity, Urine 1.036 (*)    Hgb urine dipstick SMALL (*)    Protein, ur TRACE (*)    Leukocytes,Ua LARGE (*)    All other components within normal limits  GC/CHLAMYDIA PROBE AMP ([place]) NOT AT [facility]    EKG None  Radiology No results found.  Procedures Procedures    Medications Ordered in ED Medications  cefTRIAXone (ROCEPHIN) injection 500 mg (500 mg Intramuscular Given 04/05/22 1405)    ED Course/ Medical Decision Making/ A&P  Medical Decision Making Amount and/or Complexity of Data Reviewed Labs: ordered.  Risk Prescription drug management.   21 year old male brought in by EMS with concern for dysuria and hematuria as above.  Found to have small mount of white urethral discharge on exam.  Urinalysis positive for protein, leukocytes, blood.  Patient is provided with Rocephin, Rx for doxycycline.  Advised to follow-up with his doctor for recheck, abstain from intercourse until he knows his test results.        Final Clinical Impression(s) / ED Diagnoses Final diagnoses:  Urethritis    Rx / DC Orders ED Discharge Orders          Ordered    doxycycline (VIBRAMYCIN) 100 MG capsule  2 times daily        04/05/22 1356    phenazopyridine (PYRIDIUM) 200 MG tablet  3 times daily        04/05/22 1356              Jeannie Fend, PA-C 04/05/22 1521    Franne Forts, DO 04/19/22 [phone]

## 2022-04-05 NOTE — Discharge Instructions (Signed)
Take antibiotics as prescribed. Follow up with your doctor for recheck in 1 week. Follow up for your results in MyChart in 3-5 days, go to the health department or see your doctor for further treatment.

## 2022-04-06 LAB — GC/CHLAMYDIA PROBE AMP (~~LOC~~) NOT AT ARMC
Chlamydia: NEGATIVE
Comment: NEGATIVE
Comment: NORMAL
Neisseria Gonorrhea: POSITIVE — AB

## 2022-07-19 ENCOUNTER — Ambulatory Visit (HOSPITAL_COMMUNITY)
Admission: EM | Admit: 2022-07-19 | Discharge: 2022-07-19 | Disposition: A | Discharge status: Home / Self Care | Payer: Medicaid Other

## 2022-07-19 ENCOUNTER — Encounter (HOSPITAL_COMMUNITY): Payer: Self-pay

## 2022-07-19 DIAGNOSIS — A059 Bacterial foodborne intoxication, unspecified: Secondary | ICD-10-CM

## 2022-07-19 HISTORY — DX: Unspecified asthma, uncomplicated: J45.909

## 2022-07-19 NOTE — ED Triage Notes (Signed)
Patient reports that he began having mid abdominal pain and vomiting after eating. Patient states that when it first started he ate outdated Ravioli out of a can.  Patient states he has not taken any medication for his symptoms.

## 2022-07-19 NOTE — Discharge Instructions (Addendum)
Overall your symptoms are consistent with food poisoning.  Please heat up any canned food prior to eating per the canned food recommendations. Please follow a bland diet and ensure you are washing your hands with soap and water after using the restroom to help prevent the spread of infection.   Please return to clinic or seek immediate care if you develop fever, worsening abdominal pain, uncontrolled vomiting, or any changes of condition.

## 2022-07-19 NOTE — ED Provider Notes (Signed)
[facility]    CSN: [id number] Arrival date & time: 07/19/22  [id number]      History   Chief Complaint Chief Complaint  Patient presents with   Abdominal Pain   Emesis    HPI Ricky Kim. is a 22 y.o. male.   About 5 days ago the patient reports eating ravioli out of the can, without heating his food. Had a mild sore throat after emesis, this has resolved. After this, he vomited about 3 times.  Since then he has had generalized abdominal pain and diarrhea, last episode was today.  Denies hematemesis or blood in his stool. Denies fevers. Reports normal oral intake. Denies cough, SOB, or chest pain.      The history is provided by the patient.  Abdominal Pain Associated symptoms: diarrhea and vomiting   Associated symptoms: no chest pain, no chills, no cough, no dysuria, no fever, no shortness of breath and no sore throat   Emesis Associated symptoms: abdominal pain and diarrhea   Associated symptoms: no arthralgias, no chills, no cough, no fever and no sore throat     Past Medical History:  Diagnosis Date   Asthma     There are no problems to display for this patient.   History reviewed. No pertinent surgical history.     Home Medications    Prior to Admission medications   Medication Sig Start Date End Date Taking? Authorizing Provider  doxycycline (VIBRAMYCIN) 100 MG capsule Take 1 capsule (100 mg total) by mouth 2 (two) times daily. 09/25/20   Fransico Meadow, PA-C  doxycycline (VIBRAMYCIN) 100 MG capsule Take 1 capsule (100 mg total) by mouth 2 (two) times daily. 10/09/20   Suzy Bouchard, PA-C    Family History History reviewed. No pertinent family history.  Social History Social History   Tobacco Use   Smoking status: Every Day    Types: Cigarettes, Cigars   Smokeless tobacco: Never  Vaping Use   Vaping Use: Never used  Substance Use Topics   Alcohol use: Yes   Drug use: Yes    Types: Marijuana     Allergies   Patient has no  known allergies.   Review of Systems Review of Systems  Constitutional:  Negative for chills and fever.  HENT:  Negative for ear pain and sore throat.   Eyes:  Negative for pain and visual disturbance.  Respiratory:  Negative for cough and shortness of breath.   Cardiovascular:  Negative for chest pain and palpitations.  Gastrointestinal:  Positive for abdominal pain, diarrhea and vomiting.  Genitourinary:  Negative for dysuria.  Musculoskeletal:  Negative for arthralgias and back pain.  Skin:  Negative for color change and rash.  Neurological:  Negative for seizures and syncope.  All other systems reviewed and are negative.    Physical Exam Triage Vital Signs ED Triage Vitals  Enc Vitals Group     BP 07/19/22 0934 136/76     Pulse Rate 07/19/22 0934 (!) 59     Resp 07/19/22 0934 14     Temp 07/19/22 0934 98 F (36.7 C)     Temp Source 07/19/22 0934 Oral     SpO2 07/19/22 0934 98 %     Weight --      Height --      Head Circumference --      Peak Flow --      Pain Score 07/19/22 0935 6     Pain Loc --  Pain Edu? --      Excl. in [place]? --    No data found.  Updated Vital Signs BP 136/76 (BP Location: Left Arm)   Pulse (!) 59   Temp 98 F (36.7 C) (Oral)   Resp 14   SpO2 98%   Visual Acuity Right Eye Distance:   Left Eye Distance:   Bilateral Distance:    Right Eye Near:   Left Eye Near:    Bilateral Near:     Physical Exam Vitals and nursing note reviewed.  Constitutional:      General: He is not in acute distress.    Appearance: He is well-developed.  HENT:     Head: Normocephalic and atraumatic.  Eyes:     Conjunctiva/sclera: Conjunctivae normal.  Cardiovascular:     Rate and Rhythm: Normal rate and regular rhythm.     Heart sounds: Normal heart sounds. No murmur heard. Pulmonary:     Effort: Pulmonary effort is normal. No respiratory distress.     Breath sounds: Normal breath sounds.  Abdominal:     General: Abdomen is flat. Bowel sounds  are normal. There is no distension.     Palpations: Abdomen is soft.     Tenderness: There is generalized abdominal tenderness. There is no guarding or rebound. Negative signs include Murphy's sign, Rovsing's sign and McBurney's sign.  Musculoskeletal:        General: No swelling.     Cervical back: Neck supple.  Skin:    General: Skin is warm and dry.     Capillary Refill: Capillary refill takes less than 2 seconds.  Neurological:     Mental Status: He is alert.  Psychiatric:        Mood and Affect: Mood normal.      UC Treatments / Results  Labs (all labs ordered are listed, but only abnormal results are displayed) Labs Reviewed - No data to display  EKG   Radiology No results found.  Procedures Procedures (including critical care time)  Medications Ordered in UC Medications - No data to display  Initial Impression / Assessment and Plan / UC Course  I have reviewed the triage vital signs and the nursing notes.  Pertinent labs & imaging results that were available during my care of the patient were reviewed by me and considered in my medical decision making (see chart for details).  Vitals in triage reviewed, patient is hemodynamically stable.  Lungs vesicular, active bowel sounds.  Mild generalized abdominal tenderness without guarding or rebound.  Afebrile, abdomen soft, low concern for acute abdomen.  Suspect food poisoning due to not eating canned food, symptomatic relief and bland diet discussed.  Return emergency precautions reviewed, patient verbalized understanding.  No questions at this time.    Final Clinical Impressions(s) / UC Diagnoses   Final diagnoses:  Food poisoning     Discharge Instructions      Overall your symptoms are consistent with food poisoning.  Please heat up any canned food prior to eating per the canned food recommendations. Please follow a bland diet and ensure you are washing your hands with soap and water after using the restroom  to help prevent the spread of infection.   Please return to clinic or seek immediate care if you develop fever, worsening abdominal pain, uncontrolled vomiting, or any changes of condition.      ED Prescriptions   None    I have reviewed the PDMP during this encounter.   Louretta Shorten, [place],  FNP 07/19/22 1002

## 2022-07-22 ENCOUNTER — Other Ambulatory Visit: Payer: Self-pay

## 2022-07-22 ENCOUNTER — Emergency Department (HOSPITAL_COMMUNITY): Payer: Medicaid Other

## 2022-07-22 ENCOUNTER — Emergency Department (HOSPITAL_COMMUNITY)
Admission: EM | Admit: 2022-07-22 | Discharge: 2022-07-22 | Disposition: A | Discharge status: Home / Self Care | Payer: Medicaid Other | Attending: Emergency Medicine | Admitting: Emergency Medicine

## 2022-07-22 ENCOUNTER — Encounter (HOSPITAL_COMMUNITY): Payer: Self-pay

## 2022-07-22 DIAGNOSIS — R55 Syncope and collapse: Secondary | ICD-10-CM | POA: Insufficient documentation

## 2022-07-22 DIAGNOSIS — Y9389 Activity, other specified: Secondary | ICD-10-CM | POA: Diagnosis not present

## 2022-07-22 DIAGNOSIS — J45909 Unspecified asthma, uncomplicated: Secondary | ICD-10-CM | POA: Diagnosis not present

## 2022-07-22 DIAGNOSIS — S0993XA Unspecified injury of face, initial encounter: Secondary | ICD-10-CM | POA: Diagnosis present

## 2022-07-22 DIAGNOSIS — R519 Headache, unspecified: Secondary | ICD-10-CM | POA: Insufficient documentation

## 2022-07-22 DIAGNOSIS — Y907 Blood alcohol level of 200-239 mg/100 ml: Secondary | ICD-10-CM | POA: Insufficient documentation

## 2022-07-22 DIAGNOSIS — Y92009 Unspecified place in unspecified non-institutional (private) residence as the place of occurrence of the external cause: Secondary | ICD-10-CM | POA: Insufficient documentation

## 2022-07-22 DIAGNOSIS — R4020 Unspecified coma: Secondary | ICD-10-CM

## 2022-07-22 DIAGNOSIS — M542 Cervicalgia: Secondary | ICD-10-CM | POA: Insufficient documentation

## 2022-07-22 DIAGNOSIS — F10920 Alcohol use, unspecified with intoxication, uncomplicated: Secondary | ICD-10-CM | POA: Diagnosis not present

## 2022-07-22 DIAGNOSIS — F17200 Nicotine dependence, unspecified, uncomplicated: Secondary | ICD-10-CM | POA: Insufficient documentation

## 2022-07-22 LAB — BASIC METABOLIC PANEL
Anion gap: 10 (ref 5–15)
BUN: 10 mg/dL (ref 6–20)
CO2: 23 mmol/L (ref 22–32)
Calcium: 9 mg/dL (ref 8.9–10.3)
Chloride: 106 mmol/L (ref 98–111)
Creatinine, Ser: 0.68 mg/dL (ref 0.61–1.24)
GFR, Estimated: >60 mL/min (ref >60)
Glucose, Bld: 100 mg/dL — ABNORMAL HIGH (ref 70–99)
Potassium: 3.3 mmol/L — ABNORMAL LOW (ref 3.5–5.1)
Sodium: 139 mmol/L (ref 135–145)

## 2022-07-22 LAB — CBC
HCT: 45.5 % (ref 39.0–52.0)
Hemoglobin: 15.5 g/dL (ref 13.0–17.0)
MCH: 32.4 pg (ref 26.0–34.0)
MCHC: 34.1 g/dL (ref 30.0–36.0)
MCV: 95.2 fL (ref 80.0–100.0)
Platelets: 217 10*3/uL (ref 150–400)
RBC: 4.78 MIL/uL (ref 4.22–5.81)
RDW: 12.1 % (ref 11.5–15.5)
WBC: 5.8 10*3/uL (ref 4.0–10.5)
nRBC: 0 % (ref 0.0–0.2)

## 2022-07-22 LAB — CBG MONITORING, ED: Glucose-Capillary: 99 mg/dL (ref 70–99)

## 2022-07-22 LAB — ETHANOL: Alcohol, Ethyl (B): 216 mg/dL — ABNORMAL HIGH (ref <10)
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
## 2022-12-05 ENCOUNTER — Other Ambulatory Visit: Payer: Self-pay

## 2022-12-05 ENCOUNTER — Emergency Department (HOSPITAL_COMMUNITY)
Admission: EM | Admit: 2022-12-05 | Discharge: 2022-12-05 | Disposition: A | Discharge status: Home / Self Care | Payer: 59 | Attending: Emergency Medicine | Admitting: Emergency Medicine

## 2022-12-05 DIAGNOSIS — M7918 Myalgia, other site: Secondary | ICD-10-CM | POA: Diagnosis present

## 2022-12-05 DIAGNOSIS — M791 Myalgia, unspecified site: Secondary | ICD-10-CM

## 2022-12-05 DIAGNOSIS — M79605 Pain in left leg: Secondary | ICD-10-CM | POA: Insufficient documentation

## 2022-12-05 DIAGNOSIS — M542 Cervicalgia: Secondary | ICD-10-CM | POA: Diagnosis not present

## 2022-12-05 DIAGNOSIS — R202 Paresthesia of skin: Secondary | ICD-10-CM | POA: Diagnosis not present

## 2022-12-05 LAB — CBC WITH DIFFERENTIAL/PLATELET
Abs Immature Granulocytes: 0.02 10*3/uL (ref 0.00–0.07)
Basophils Absolute: 0 10*3/uL (ref 0.0–0.1)
Basophils Relative: 0 %
Eosinophils Absolute: 0.1 10*3/uL (ref 0.0–0.5)
Eosinophils Relative: 1 %
HCT: 43.1 % (ref 39.0–52.0)
Hemoglobin: 14.7 g/dL (ref 13.0–17.0)
Immature Granulocytes: 0 %
Lymphocytes Relative: 27 %
Lymphs Abs: 1.7 10*3/uL (ref 0.7–4.0)
MCH: 32.2 pg (ref 26.0–34.0)
MCHC: 34.1 g/dL (ref 30.0–36.0)
MCV: 94.3 fL (ref 80.0–100.0)
Monocytes Absolute: 0.7 10*3/uL (ref 0.1–1.0)
Monocytes Relative: 11 %
Neutro Abs: 3.7 10*3/uL (ref 1.7–7.7)
Neutrophils Relative %: 61 %
Platelets: 184 10*3/uL (ref 150–400)
RBC: 4.57 MIL/uL (ref 4.22–5.81)
RDW: 11.4 % — ABNORMAL LOW (ref 11.5–15.5)
WBC: 6.2 10*3/uL (ref 4.0–10.5)
nRBC: 0 % (ref 0.0–0.2)

## 2022-12-05 LAB — COMPREHENSIVE METABOLIC PANEL
ALT: 17 U/L (ref 0–44)
AST: 27 U/L (ref 15–41)
Albumin: 3.8 g/dL (ref 3.5–5.0)
Alkaline Phosphatase: 47 U/L (ref 38–126)
Anion gap: 9 (ref 5–15)
BUN: 11 mg/dL (ref 6–20)
CO2: 22 mmol/L (ref 22–32)
Calcium: 9.3 mg/dL (ref 8.9–10.3)
Chloride: 107 mmol/L (ref 98–111)
Creatinine, Ser: 0.98 mg/dL (ref 0.61–1.24)
GFR, Estimated: >60 mL/min (ref >60)
Glucose, Bld: 88 mg/dL (ref 70–99)
Potassium: 4 mmol/L (ref 3.5–5.1)
Sodium: 138 mmol/L (ref 135–145)
Total Bilirubin: 0.9 mg/dL (ref 0.3–1.2)
Total Protein: 6.9 g/dL (ref 6.5–8.1)

## 2022-12-05 LAB — MAGNESIUM: Magnesium: 2.1 mg/dL (ref 1.7–2.4)

## 2022-12-05 NOTE — ED Provider Notes (Signed)
[name] Provider Note   CSN: [id number] Arrival date & time: 12/05/22  1024     History  Chief Complaint  Patient presents with   Muscle Pain    Ricky Kim is a 22 y.o. male.  With no pertinent past medical history who presents to the emergency department with myalgias.  States symptoms have been ongoing for 3 years.  He describes having left-sided body pain and tingling.  He states that about 3 years ago he was lifting weights when he felt a pain in his left leg.  Denies feeling pain in his back or neck at that time.  He states that since then he has had daily symptoms like this.  He also notes that when he works out he gets dizzy.  He denies any recent changes to his symptoms or progressively worsening symptoms.  Denies vision changes, weakness or dropping items, syncope, chest pain, shortness of breath, room spinning sensation.  Denies family history of MS.  HPI     Home Medications Prior to Admission medications   Medication Sig Start Date End Date Taking? Authorizing Provider  DULoxetine (CYMBALTA) 30 MG capsule Take 30 mg by mouth daily. 03/28/21   [provider]  famotidine (PEPCID) 20 MG tablet Take 20 mg by mouth daily. 04/05/21   [provider]  phenazopyridine (PYRIDIUM) 200 MG tablet Take 1 tablet (200 mg total) by mouth 3 (three) times daily. 04/05/22   Jeannie Fend, PA-C  sertraline (ZOLOFT) 25 MG tablet Take 25 mg by mouth daily. 02/15/21   [provider]      Allergies    Patient has no known allergies.    Review of Systems   Review of Systems  Neurological:  Positive for dizziness and light-headedness.  All other systems reviewed and are negative.   Physical Exam Updated Vital Signs BP 111/63   Pulse (!) 59   Temp 98.6 F (37 C) (Oral)   Resp 18   Ht 5\' 8"  (1.727 m)   Wt 62.1 kg   SpO2 99%   BMI 20.83 kg/m  Physical Exam Vitals and nursing note reviewed.   Constitutional:      General: He is not in acute distress.    Appearance: Normal appearance. He is not ill-appearing or toxic-appearing.  HENT:     Head: Normocephalic.     Mouth/Throat:     Mouth: Mucous membranes are moist.     Pharynx: Oropharynx is clear.  Eyes:     General: No visual field deficit or scleral icterus.    Extraocular Movements: Extraocular movements intact.     Right eye: Normal extraocular motion and no nystagmus.     Left eye: Normal extraocular motion and no nystagmus.     Pupils: Pupils are equal, round, and reactive to light.  Neck:   Cardiovascular:     Rate and Rhythm: Normal rate and regular rhythm.  Pulmonary:     Effort: Pulmonary effort is normal.     Breath sounds: Normal breath sounds.  Abdominal:     General: Bowel sounds are normal.     Palpations: Abdomen is soft.  Musculoskeletal:     Cervical back: Muscular tenderness present. No spinous process tenderness.  Skin:    General: Skin is warm and dry.     Capillary Refill: Capillary refill takes less than 2 seconds.  Neurological:     General: No focal deficit present.     Mental  Status: He is alert and oriented to person, place, and time.     GCS: GCS eye subscore is 4. GCS verbal subscore is 5. GCS motor subscore is 6.     Cranial Nerves: Cranial nerves 2-12 are intact. No dysarthria or facial asymmetry.     Sensory: Sensation is intact.     Motor: Motor function is intact. No weakness.     Coordination: Finger-Nose-Finger Test and Heel to [place] Test normal.     Gait: Gait is intact.  Psychiatric:        Attention and Perception: Attention and perception normal.        Mood and Affect: Mood normal.        Behavior: Behavior normal.     ED Results / Procedures / Treatments   Labs (all labs ordered are listed, but only abnormal results are displayed) Labs Reviewed  CBC WITH DIFFERENTIAL/PLATELET - Abnormal; Notable for the following components:      Result Value   RDW 11.4 (*)     All other components within normal limits  COMPREHENSIVE METABOLIC PANEL  MAGNESIUM  CBC WITH DIFFERENTIAL/PLATELET    EKG None  Radiology No results found.  Procedures Procedures   Medications Ordered in ED Medications - No data to display  ED Course/ Medical Decision Making/ A&P   {   Medical Decision Making Amount and/or Complexity of Data Reviewed Labs: ordered.  Initial Impression and Ddx 22 year old male who presents to the emergency department with constellation of symptoms as above Patient PMH that increases complexity of ED encounter: None  Interpretation of Diagnostics I independent reviewed and interpreted the labs as followed: CBC within normal limits, CMP without electrolyte dysfunction, AKI or transaminitis, magnesium normal  - I independently visualized the following imaging with scope of interpretation limited to determining acute life threatening conditions related to emergency care: Not indicated  Patient Reassessment and Ultimate Disposition/Management 22 year old male who presents to the emergency department with a constellation of symptoms that are been going on for 3 years.  His physical exam is unremarkable.  He does have some tenderness to palpation of the left trapezius.  There is no focal weakness.  No focal neurological findings.  Basic labs were ordered which showed no acute findings.  Additionally ordered a magnesium which is also normal. No indication at this time for imaging of his head or spine.  Reassessment, the patient is stable.  Unclear etiology of his symptoms.  On chart review he has been seen a few times in the emergency department for parts of this constellation of symptoms that he presents with today.  He has been seen previously for left leg pain that he describes similarly to today.  Additionally he has been seen before for having dizziness with exercise which she also describes today.  I have lower suspicion that he has  degenerating neurological disease like multiple sclerosis.  He has no weakness or dropping items or falling due to his leg giving out.  Again no changes to his vision, no focal findings on exam.  He has no headache with fever or meningismus concerning for CNS infection.  He would like a new referral to a different PCP which I have provided for him who can provide an ongoing workup of his symptoms.  Do not feel that he has any emergent needs at this time to be worked up in the emergency department.  The patient has been appropriately medically screened and/or stabilized in the ED. I have low suspicion  for any other emergent medical condition which would require further screening, evaluation or treatment in the ED or require inpatient management. At time of discharge the patient is hemodynamically stable and in no acute distress. I have discussed work-up results and diagnosis with patient and answered all questions. Patient is agreeable with discharge plan. We discussed strict return precautions for returning to the emergency department and they verbalized understanding.     Patient management required discussion with the following services or consulting groups:  None  Complexity of Problems Addressed Chronic illness with exacerbation  Additional Data Reviewed and Analyzed Further history obtained from: Past medical history and medications listed in the EMR, Prior ED visit notes, and Care Everywhere  Patient Encounter Risk Assessment None  Final Clinical Impression(s) / ED Diagnoses Final diagnoses:  Muscle ache    Rx / DC Orders ED Discharge Orders          Ordered    Ambulatory Referral to Primary Care        12/05/22 1336              Cristopher Peru, PA-C 12/05/22 1351    Loetta Rough, MD 12/05/22 1421

## 2022-12-05 NOTE — ED Triage Notes (Signed)
Pt arrives POV with complaints of left sided body pain and neck pain that started after lifting weights 3 years ago. Pt states he feels his left side is weaker than normal. Pt ambulatory to room and on phone during triage.

## 2022-12-05 NOTE — Discharge Instructions (Signed)
You were seen in the emergency department today for pain to the left side.  I am sending referral to primary care for you to have ongoing workup of your symptoms.  Your labs here today were normal.  Please return for significantly worsening symptoms.

## 2022-12-19 ENCOUNTER — Emergency Department (HOSPITAL_COMMUNITY): Payer: 59

## 2022-12-19 ENCOUNTER — Encounter (HOSPITAL_COMMUNITY): Payer: Self-pay | Admitting: Emergency Medicine

## 2022-12-19 ENCOUNTER — Ambulatory Visit (INDEPENDENT_AMBULATORY_CARE_PROVIDER_SITE_OTHER)
Admission: EM | Admit: 2022-12-19 | Discharge: 2022-12-19 | Disposition: A | Discharge status: Home / Self Care | Payer: 59 | Source: Home / Self Care | Attending: Family Medicine | Admitting: Family Medicine

## 2022-12-19 ENCOUNTER — Emergency Department (HOSPITAL_COMMUNITY)
Admission: EM | Admit: 2022-12-19 | Discharge: 2022-12-19 | Disposition: A | Discharge status: Home / Self Care | Payer: 59 | Attending: Emergency Medicine | Admitting: Emergency Medicine

## 2022-12-19 ENCOUNTER — Other Ambulatory Visit: Payer: Self-pay

## 2022-12-19 DIAGNOSIS — J029 Acute pharyngitis, unspecified: Secondary | ICD-10-CM | POA: Diagnosis present

## 2022-12-19 DIAGNOSIS — M542 Cervicalgia: Secondary | ICD-10-CM | POA: Insufficient documentation

## 2022-12-19 DIAGNOSIS — M791 Myalgia, unspecified site: Secondary | ICD-10-CM | POA: Insufficient documentation

## 2022-12-19 DIAGNOSIS — K047 Periapical abscess without sinus: Secondary | ICD-10-CM | POA: Insufficient documentation

## 2022-12-19 DIAGNOSIS — Z743 Need for continuous supervision: Secondary | ICD-10-CM | POA: Diagnosis not present

## 2022-12-19 DIAGNOSIS — R609 Edema, unspecified: Secondary | ICD-10-CM | POA: Diagnosis not present

## 2022-12-19 DIAGNOSIS — J351 Hypertrophy of tonsils: Secondary | ICD-10-CM | POA: Diagnosis not present

## 2022-12-19 DIAGNOSIS — R590 Localized enlarged lymph nodes: Secondary | ICD-10-CM | POA: Diagnosis not present

## 2022-12-19 LAB — CBC WITH DIFFERENTIAL/PLATELET
Abs Immature Granulocytes: 0.08 10*3/uL — ABNORMAL HIGH (ref 0.00–0.07)
Basophils Absolute: 0 10*3/uL (ref 0.0–0.1)
Basophils Relative: 0 %
Eosinophils Absolute: 0 10*3/uL (ref 0.0–0.5)
Eosinophils Relative: 0 %
HCT: 45.5 % (ref 39.0–52.0)
Hemoglobin: 15.6 g/dL (ref 13.0–17.0)
Immature Granulocytes: 1 %
Lymphocytes Relative: 11 %
Lymphs Abs: 1.8 10*3/uL (ref 0.7–4.0)
MCH: 32.6 pg (ref 26.0–34.0)
MCHC: 34.3 g/dL (ref 30.0–36.0)
MCV: 95.2 fL (ref 80.0–100.0)
Monocytes Absolute: 1.6 10*3/uL — ABNORMAL HIGH (ref 0.1–1.0)
Monocytes Relative: 10 %
Neutro Abs: 13.1 10*3/uL — ABNORMAL HIGH (ref 1.7–7.7)
Neutrophils Relative %: 78 %
Platelets: 163 10*3/uL (ref 150–400)
RBC: 4.78 MIL/uL (ref 4.22–5.81)
RDW: 11.6 % (ref 11.5–15.5)
WBC: 16.6 10*3/uL — ABNORMAL HIGH (ref 4.0–10.5)
nRBC: 0 % (ref 0.0–0.2)

## 2022-12-19 LAB — COMPREHENSIVE METABOLIC PANEL
ALT: 16 U/L (ref 0–44)
AST: 25 U/L (ref 15–41)
Albumin: 4 g/dL (ref 3.5–5.0)
Alkaline Phosphatase: 59 U/L (ref 38–126)
Anion gap: 13 (ref 5–15)
BUN: 10 mg/dL (ref 6–20)
CO2: 24 mmol/L (ref 22–32)
Calcium: 9.7 mg/dL (ref 8.9–10.3)
Chloride: 101 mmol/L (ref 98–111)
Creatinine, Ser: 0.85 mg/dL (ref 0.61–1.24)
GFR, Estimated: >60 mL/min (ref >60)
Glucose, Bld: 87 mg/dL (ref 70–99)
Potassium: 4.1 mmol/L (ref 3.5–5.1)
Sodium: 138 mmol/L (ref 135–145)
Total Bilirubin: 0.4 mg/dL (ref 0.3–1.2)
Total Protein: 7.8 g/dL (ref 6.5–8.1)

## 2022-12-19 LAB — TSH: TSH: 0.42 u[IU]/mL (ref 0.350–4.500)

## 2022-12-19 LAB — HIV ANTIBODY (ROUTINE TESTING W REFLEX): HIV Screen 4th Generation wRfx: NONREACTIVE

## 2022-12-19 MED ORDER — AMOXICILLIN-POT CLAVULANATE 875-125 MG PO TABS: 1.0000 | ORAL_TABLET | Freq: Two times a day (BID) | ORAL | 0 refills | Status: AC

## 2022-12-19 MED ORDER — AMOXICILLIN-POT CLAVULANATE 875-125 MG PO TABS
1.0000 | ORAL_TABLET | Freq: Once | ORAL | Status: AC
  Administered 2022-12-19: 1 via ORAL
  Filled 2022-12-19: qty 1

## 2022-12-19 MED ORDER — IOHEXOL 350 MG/ML SOLN
75.0000 mL | Freq: Once | INTRAVENOUS | Status: AC | PRN
  Administered 2022-12-19: 75 mL via INTRAVENOUS
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
## 2022-12-19 NOTE — ED Provider Notes (Signed)
[place] EMERGENCY DEPARTMENT AT [facility] Provider Note   CSN: [id number] Arrival date & time: 12/19/22  1906     History  Chief Complaint  Patient presents with   Sore Throat    Ricky Kim is a 22 y.o. male here presenting with neck swelling and sore throat.  Patient states that he has poor dentition at baseline.  He states that he does not have a dentist.  He states that he noticed the left side of his neck more swollen for the last several days.  Patient went to urgent care and had labs drawn including thyroid function that were normal.  Patient is sent here for CT scan versus ultrasound.  The history is provided by the patient.       Home Medications Prior to Admission medications   Medication Sig Start Date End Date Taking? Authorizing Provider  amoxicillin-clavulanate (AUGMENTIN) 875-125 MG tablet Take 1 tablet by mouth every 12 (twelve) hours. 12/19/22  Yes Charlynne Pander, MD      Allergies    Patient has no known allergies.    Review of Systems   Review of Systems  HENT:  Positive for dental problem.   All other systems reviewed and are negative.   Physical Exam Updated Vital Signs BP 99/80 (BP Location: Left Arm)   Pulse 99   Temp 100 F (37.8 C) (Oral)   Resp 16   Ht 5\' 8"  (1.727 m)   Wt 61.2 kg   SpO2 100%   BMI 20.53 kg/m  Physical Exam Vitals and nursing note reviewed.  Constitutional:      Appearance: He is well-developed.  HENT:     Head: Normocephalic.     Mouth/Throat:      Comments: Patient has poor dentition overall.  Patient has obvious cavities of the left lower molar and right upper premolar.  I do not see any obvious periapical abscess Eyes:     Conjunctiva/sclera: Conjunctivae normal.  Neck:     Comments: Patient has tender lymph node in the left submandibular area Cardiovascular:     Rate and Rhythm: Normal rate and regular rhythm.  Pulmonary:     Effort: Pulmonary effort is normal.     Breath sounds:  Normal breath sounds.  Abdominal:     General: Bowel sounds are normal.     Palpations: Abdomen is soft.  Skin:    General: Skin is warm.     Capillary Refill: Capillary refill takes less than 2 seconds.  Neurological:     General: No focal deficit present.     Mental Status: He is alert and oriented to person, place, and time.  Psychiatric:        Mood and Affect: Mood normal.     ED Results / Procedures / Treatments   Labs (all labs ordered are listed, but only abnormal results are displayed) Labs Reviewed  CBC WITH DIFFERENTIAL/PLATELET  BASIC METABOLIC PANEL    EKG None  Radiology CT Soft Tissue Neck W Contrast  Result Date: 12/19/2022 CLINICAL DATA:  Neck pain and swelling, epiglottitis or tonsillitis suspected EXAM: CT NECK WITH CONTRAST TECHNIQUE: Multidetector CT imaging of the neck was performed using the standard protocol following the bolus administration of intravenous contrast. RADIATION DOSE REDUCTION: This exam was performed according to the departmental dose-optimization program which includes automated exposure control, adjustment of the mA and/or kV according to patient size and/or use of iterative reconstruction technique. CONTRAST:  75mL OMNIPAQUE IOHEXOL 350 MG/ML  SOLN COMPARISON:  None Available. A prior neck CT from 03/16/2021 could not be retrieved. FINDINGS: Pharynx and larynx: Prominence of the bilateral palatine tonsils, with possible area of ill-defined low-density adjacent to the left palatine tonsil, but no definite peripherally enhancing collection. The larynx is unremarkable. Salivary glands: No inflammation, mass, or stone. Thyroid: Normal. Lymph nodes: Mildly prominent bilateral level 2A lymph nodes, which measure up to 1 cm on the left and 1.3 cm on the right. Additional prominent left level 2B lymph node measures up to 0.9 cm in short axis. No abnormal density lymph nodes. Vascular: Patent. 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 Limited intracranial: Negative. Visualized orbits: Negative. Mastoids and visualized paranasal sinuses: Clear. Skeleton: No acute osseous abnormality. Periapical lucencies about the roots of the left maxillary second molar and right maxillary first molar. Upper chest: Negative. Other: None. IMPRESSION: 1. Prominence of the bilateral palatine tonsils, with possible area of ill-defined low-density adjacent to the left palatine tonsil,, which may represent artifact versus phlegmon, but no definite peripherally enhancing abscess. Correlate with direct visualization. 2. Mildly prominent bilateral level 2A and left level 2B lymph nodes, which are nonspecific but favored to be reactive. 3. Periapical lucencies about the roots of the left maxillary second molar and right maxillary first molar, concerning for periapical abscesses. Dental consultation is recommended. 4. 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 Electronically Signed   By: Wiliam Ke M.D.   On: 12/19/2022 21:47    Procedures Procedures    Medications Ordered in ED Medications  amoxicillin-clavulanate (AUGMENTIN) 875-125 MG per tablet 1 tablet (has no administration in time range)  iohexol (OMNIPAQUE) 350 MG/ML injection 75 mL (75 mLs Intravenous Contrast Given 12/19/22 2053)    ED Course/ Medical Decision Making/ A&P                                 Medical Decision Making Ricky Kim is a 22 y.o. male here presenting with dental pain.  Patient has poor dentition.  Concern for possible deep space infection.  Patient has no trismus.  To get CT neck.  11:10 PM I reviewed CT neck and patient does have enlarged tonsils and also periapical abscesses.  Patient has no obvious peritonsillar abscess.  Patient will be discharged home with Augmentin.  Told him to call dentist tomorrow for  follow-up.   Problems Addressed: Dental abscess: acute illness or injury  Amount and/or Complexity of Data Reviewed Labs: ordered. Radiology: ordered.  Risk Prescription drug management.    Final Clinical Impression(s) / ED Diagnoses Final diagnoses:  Dental abscess    Rx / DC Orders ED Discharge Orders          Ordered    amoxicillin-clavulanate (AUGMENTIN) 875-125 MG tablet  Every 12 hours        12/19/22 2300              Charlynne Pander, MD 12/19/22 2311

## 2022-12-19 NOTE — ED Provider Notes (Addendum)
[facility] CARE CENTER   [id number] 12/19/22 Arrival Time: 4098  ASSESSMENT & PLAN:  1. Neck pain   2. Generalized muscle ache    Unclear cause of generalized symptoms. TSH slightly low in past; not checked recently. Denies swallowing/resp difficulties. Ant neck may be slightly enlarged but this is the first time I've seen Mr Brooke Dare. May need neck/thyroid U/S. Will make f/u with PCP to discuss.  Pending:  CBC WITH DIFFERENTIAL/PLATELET  COMPREHENSIVE METABOLIC PANEL  TSH  RPR  HIV ANTIBODY (ROUTINE TESTING W REFLEX)   OTC analgesics as needed.   Follow-up Information     Schedule an appointment as soon as possible for a visit  with Donetta Potts, MD.   Specialty: Internal Medicine Contact information: [address] [place] [address] [phone]                 Reviewed expectations re: course of current medical issues. Questions answered. Outlined signs and symptoms indicating need for more acute intervention. Understanding verbalized. After Visit Summary given.   SUBJECTIVE: History from: Patient. Ricky Kim is a 22 y.o. male. Pt reports that for years he has had numbness on left side of body and left neck lymph node pain and swelling. Reports that he has seen doctors and always told that blood work is good.  Mostly concerned about neck, specifically L side. Normal PO/air intake without difficulty. Denies fever. Without n/v/d. Also reports generalized body aches; months.  OBJECTIVE:  Vitals:   12/19/22 1024  BP: 113/86  Pulse: 83  Resp: 14  Temp: 98.7 F (37.1 C)  TempSrc: Oral  SpO2: 98%    General appearance: alert; no distress Eyes: PERRLA; EOMI; conjunctiva normal HENT: ; AT; without nasal congestion Neck: supple; ques slight bulge at anterior L neck when compared to R; no appreciable firm masses; no overlying skin changes Lungs: speaks full sentences without difficulty; unlabored Extremities: no edema Skin: warm and  dry Neurologic: normal gait Psychological: alert and cooperative; normal mood and affect  Labs Reviewed: Results for orders placed or performed during the hospital encounter of 12/05/22  Comprehensive metabolic panel  Result Value Ref Range   Sodium 138 135 - 145 mmol/L   Potassium 4.0 3.5 - 5.1 mmol/L   Chloride 107 98 - 111 mmol/L   CO2 22 22 - 32 mmol/L   Glucose, Bld 88 70 - 99 mg/dL   BUN 11 6 - 20 mg/dL   Creatinine, Ser 8.65 0.61 - 1.24 mg/dL   Calcium 9.3 8.9 - 78.4 mg/dL   Total Protein 6.9 6.5 - 8.1 g/dL   Albumin 3.8 3.5 - 5.0 g/dL   AST 27 15 - 41 U/L   ALT 17 0 - 44 U/L   Alkaline Phosphatase 47 38 - 126 U/L   Total Bilirubin 0.9 0.3 - 1.2 mg/dL   GFR, Estimated >69 >62 mL/min   Anion gap 9 5 - 15  Magnesium  Result Value Ref Range   Magnesium 2.1 1.7 - 2.4 mg/dL  CBC with Differential/Platelet  Result Value Ref Range   WBC 6.2 4.0 - 10.5 K/uL   RBC 4.57 4.22 - 5.81 MIL/uL   Hemoglobin 14.7 13.0 - 17.0 g/dL   HCT 95.2 84.1 - 32.4 %   MCV 94.3 80.0 - 100.0 fL   MCH 32.2 26.0 - 34.0 pg   MCHC 34.1 30.0 - 36.0 g/dL   RDW 40.1 (L) 02.7 - 25.3 %   Platelets 184 150 - 400 K/uL  nRBC 0.0 0.0 - 0.2 %   Neutrophils Relative % 61 %   Neutro Abs 3.7 1.7 - 7.7 K/uL   Lymphocytes Relative 27 %   Lymphs Abs 1.7 0.7 - 4.0 K/uL   Monocytes Relative 11 %   Monocytes Absolute 0.7 0.1 - 1.0 K/uL   Eosinophils Relative 1 %   Eosinophils Absolute 0.1 0.0 - 0.5 K/uL   Basophils Relative 0 %   Basophils Absolute 0.0 0.0 - 0.1 K/uL   Immature Granulocytes 0 %   Abs Immature Granulocytes 0.02 0.00 - 0.07 K/uL   Labs Reviewed  CBC WITH DIFFERENTIAL/PLATELET  COMPREHENSIVE METABOLIC PANEL  TSH  RPR  HIV ANTIBODY (ROUTINE TESTING W REFLEX)    Imaging: No results found.  No Known Allergies  Past Medical History:  Diagnosis Date   Dyspnea    Staph infection    Social History   Socioeconomic History   Marital status: Single    Spouse name: Not on file    Number of children: Not on file   Years of education: Not on file   Highest education level: Not on file  Occupational History   Not on file  Tobacco Use   Smoking status: Never   Smokeless tobacco: Never  Vaping Use   Vaping status: Former  Substance and Sexual Activity   Alcohol use: Never   Drug use: Yes    Frequency: 7.0 times per week    Types: Marijuana   Sexual activity: Not on file  Other Topics Concern   Not on file  Social History Narrative   ** Merged History Encounter **       Social Determinants of Health   Financial Resource Strain: Not on file  Food Insecurity: Not on file  Transportation Needs: Not on file  Physical Activity: Not on file  Stress: Not on file  Social Connections: Not on file  Intimate Partner Violence: Not on file   Family History  Problem Relation Age of Onset   Healthy Mother    Past Surgical History:  Procedure Laterality Date   INCISION AND DRAINAGE WOUND WITH TENDON REPAIR Bilateral 08/20/2020   Procedure: Right hand irrigation and debridement with flexor tendon repair and exploration index middle ring and small finger with repair as necessary.  Left hand irrigation and debridement of multiple lacerations with flexor tendon repair left ring finger.  Neuroplasty and repair reconstruction is necessary.;  Surgeon: Dominica Severin, MD;  Location: [facility] OR;  Service: Orthopedics;  Laterality: Ellison Carwin, MD 12/19/22 1105    Mardella Layman, MD 12/19/22 1105

## 2022-12-19 NOTE — ED Triage Notes (Signed)
Pt reports that for years he has had numbness on left side of body and left neck lymph node pain and swelling. Reports that he has seen doctors and always told that blood work is good.

## 2022-12-19 NOTE — Discharge Instructions (Addendum)
You have had labs (blood tests) sent today. We will call you with any significant abnormalities or if there is need to begin or change treatment or pursue further follow up.  You may also review your test results online through MyChart. If you do not have a MyChart account, instructions to sign up should be on your discharge paperwork.  

## 2022-12-19 NOTE — Discharge Instructions (Addendum)
As we discussed, you have dental abscess.    I recommend that you take Augmentin twice daily for a week  You need to call dentist tomorrow for appointment  Return to ER if you have worse dental pain or neck swelling or trouble swallowing

## 2022-12-20 LAB — RPR: RPR Ser Ql: NONREACTIVE

## 2023-01-13 ENCOUNTER — Emergency Department (HOSPITAL_COMMUNITY)
Admission: EM | Admit: 2023-01-13 | Discharge: 2023-01-13 | Disposition: A | Discharge status: Home / Self Care | Payer: 59 | Attending: Emergency Medicine | Admitting: Emergency Medicine

## 2023-01-13 ENCOUNTER — Encounter (HOSPITAL_COMMUNITY): Payer: Self-pay

## 2023-01-13 DIAGNOSIS — L0291 Cutaneous abscess, unspecified: Secondary | ICD-10-CM

## 2023-01-13 DIAGNOSIS — L02214 Cutaneous abscess of groin: Secondary | ICD-10-CM | POA: Diagnosis present

## 2023-01-13 MED ORDER — DOXYCYCLINE HYCLATE 100 MG PO CAPS: 100.0000 mg | ORAL_CAPSULE | Freq: Two times a day (BID) | ORAL | 0 refills | Status: AC

## 2023-01-13 MED ORDER — DOXYCYCLINE HYCLATE 100 MG PO TABS
100.0000 mg | ORAL_TABLET | Freq: Once | ORAL | Status: AC
  Administered 2023-01-13: 100 mg via ORAL
  Filled 2023-01-13: qty 1

## 2023-01-13 NOTE — ED Triage Notes (Signed)
PT arrives via POV. Pt believes he was bitten by a spider on his right hip 4 days ago. Pt does have a wound/abscess that is draining. Denies any other complaints. Denies fever

## 2023-01-13 NOTE — ED Provider Notes (Signed)
[place] EMERGENCY DEPARTMENT AT [facility] Provider Note   CSN: [id number] Arrival date & time: 01/13/23  1753     History  Chief Complaint  Patient presents with   Insect Bite    Ricky Kim is a 22 y.o. male.  Presenting for evaluation of a skin infection.  He states he noticed a wound to the right outer groin approximately 4 days ago.  Believes he was bit by a spider.  Wound has continuously gotten larger.  Wound opened and began draining 2 days ago.  He reports some discomfort today which is the reason for his presentation.  He denies any fevers, chills, nausea, vomiting, abdominal pain, urinary or bowel abnormalities.  HPI     Home Medications Prior to Admission medications   Medication Sig Start Date End Date Taking? Authorizing Provider  doxycycline (VIBRAMYCIN) 100 MG capsule Take 1 capsule (100 mg total) by mouth 2 (two) times daily. 01/13/23  Yes Kashif Pooler, Edsel Petrin, PA-C  amoxicillin-clavulanate (AUGMENTIN) 875-125 MG tablet Take 1 tablet by mouth every 12 (twelve) hours. 12/19/22   Charlynne Pander, MD      Allergies    Patient has no known allergies.    Review of Systems   Review of Systems  Skin:  Positive for wound.  All other systems reviewed and are negative.   Physical Exam Updated Vital Signs BP 116/71 (BP Location: Right Arm)   Pulse 75   Temp 98.6 F (37 C) (Oral)   Resp 18   SpO2 99%  Physical Exam Vitals and nursing note reviewed.  Constitutional:      General: He is not in acute distress.    Appearance: Normal appearance. He is normal weight. He is not ill-appearing.     Comments: Resting comfortably in bed  HENT:     Head: Normocephalic and atraumatic.  Pulmonary:     Effort: Pulmonary effort is normal. No respiratory distress.  Abdominal:     General: Abdomen is flat.  Musculoskeletal:        General: Normal range of motion.     Cervical back: Neck supple.  Skin:    General: Skin is warm and dry.     Comments: 2  cm x 3 cm area of induration and erythema to the right outer groin. Wound is open with scant purulent drainage able to be expressed  Neurological:     Mental Status: He is alert and oriented to person, place, and time.  Psychiatric:        Mood and Affect: Mood normal.        Behavior: Behavior normal.     ED Results / Procedures / Treatments   Labs (all labs ordered are listed, but only abnormal results are displayed) Labs Reviewed - No data to display  EKG None  Radiology No results found.  Procedures Procedures    Medications Ordered in ED Medications  doxycycline (VIBRA-TABS) tablet 100 mg (100 mg Oral Given 01/13/23 2224)    ED Course/ Medical Decision Making/ A&P                                 Medical Decision Making  This patient presents to the ED for concern of skin infection, this involves an extensive number of treatment options, and is a complaint that carries with it a high risk of complications and morbidity.  The differential diagnosis includes abscess, cellulitis, necrotizing fasciitis,  necrotic lymph node  Co morbidities that complicate the patient evaluation  hx of staph infection  My initial workup includes antibiotics  Additional history obtained from: Nursing notes from this visit. Family significant other at bedside and provides a portion of the history  Afebrile, hemodynamically stable.  22 year old male presenting to the ED for evaluation of the skin infection to the right outer groin.  This began 4 days ago.  Began to drain 2 days ago.  On exam, there is a 2 cm x 3 cm area of induration that is actively draining purulent material.  This is consistent with an abscess.  I discussed the need for continued draining of the abscess.  Patient declines incision and drainage here in the emergency department and states he will continue to express the drainage at home.  He will be started on doxycycline for abscess given his history of staph infections.   His tetanus vaccine is up-to-date.  He denies any signs or symptoms of systemic infection.  He was given return precautions.  Stable at discharge.  At this time there does not appear to be any evidence of an acute emergency medical condition and the patient appears stable for discharge with appropriate outpatient follow up. Diagnosis was discussed with patient who verbalizes understanding of care plan and is agreeable to discharge. I have discussed return precautions with patient who verbalizes understanding. Patient encouraged to follow-up with their PCP within 1 week. All questions answered.  Note: Portions of this report may have been transcribed using voice recognition software. Every effort was made to ensure accuracy; however, inadvertent computerized transcription errors may still be present.        Final Clinical Impression(s) / ED Diagnoses Final diagnoses:  Abscess    Rx / DC Orders ED Discharge Orders          Ordered    doxycycline (VIBRAMYCIN) 100 MG capsule  2 times daily        01/13/23 2250              Michelle Piper, Cordelia Poche 01/13/23 2252    Maia Plan, MD 01/15/23 [phone]

## 2023-01-13 NOTE — Discharge Instructions (Addendum)
You have been seen today for your complaint of skin abscess. Your discharge medications include doxycycline. This is an antibiotic. You should take it as prescribed. You should take it for the entire duration of the prescription. This may cause an upset stomach. This is normal. You may take this with food. You may also eat yogurt to prevent diarrhea. Home care instructions are as follows:  Keep the area clean and dry. Clean with warm soapy water once daily Follow up with: a PCP of your choice in one week for reevaluation Please seek immediate medical care if you develop any of the following symptoms: You have very bad pain. You make less pee (urine) than normal. At this time there does not appear to be the presence of an emergent medical condition, however there is always the potential for conditions to change. Please read and follow the below instructions.  Do not take your medicine if  develop an itchy rash, swelling in your mouth or lips, or difficulty breathing; call 911 and seek immediate emergency medical attention if this occurs.  You may review your lab tests and imaging results in their entirety on your MyChart account.  Please discuss all results of fully with your primary care provider and other specialist at your follow-up visit.  Note: Portions of this text may have been transcribed using voice recognition software. Every effort was made to ensure accuracy; however, inadvertent computerized transcription errors may still be present.
